# Patient Record
Sex: Male | Born: 1960 | Race: White | Hispanic: No | Smoking: Current every day smoker
Health system: Southern US, Community
[De-identification: ages and names within clinical notes are randomized; demographics above are authoritative.]

## PROBLEM LIST (undated history)

## (undated) DIAGNOSIS — F419 Anxiety disorder, unspecified: Secondary | ICD-10-CM

## (undated) DIAGNOSIS — Z85818 Personal history of malignant neoplasm of other sites of lip, oral cavity, and pharynx: Secondary | ICD-10-CM

## (undated) DIAGNOSIS — Z5189 Encounter for other specified aftercare: Secondary | ICD-10-CM

## (undated) DIAGNOSIS — C801 Malignant (primary) neoplasm, unspecified: Secondary | ICD-10-CM

## (undated) DIAGNOSIS — Z9221 Personal history of antineoplastic chemotherapy: Secondary | ICD-10-CM

## (undated) DIAGNOSIS — Z87898 Personal history of other specified conditions: Secondary | ICD-10-CM

## (undated) DIAGNOSIS — Z923 Personal history of irradiation: Secondary | ICD-10-CM

## (undated) HISTORY — DX: Personal history of malignant neoplasm of other sites of lip, oral cavity, and pharynx: Z85.818

## (undated) HISTORY — DX: Personal history of other specified conditions: Z87.898

## (undated) HISTORY — DX: Encounter for other specified aftercare: Z51.89

## (undated) HISTORY — DX: Personal history of antineoplastic chemotherapy: Z92.21

## (undated) HISTORY — DX: Anxiety disorder, unspecified: F41.9

## (undated) HISTORY — DX: Malignant (primary) neoplasm, unspecified: C80.1

## (undated) HISTORY — DX: Personal history of irradiation: Z92.3

---

## 2005-02-27 ENCOUNTER — Emergency Department (HOSPITAL_COMMUNITY): Admission: EM | Admit: 2005-02-27 | Discharge: 2005-02-28 | Payer: Self-pay | Admitting: Emergency Medicine

## 2005-03-17 ENCOUNTER — Encounter: Admission: RE | Admit: 2005-03-17 | Discharge: 2005-03-17 | Payer: Self-pay | Admitting: Neurology

## 2009-07-07 DIAGNOSIS — Z9221 Personal history of antineoplastic chemotherapy: Secondary | ICD-10-CM

## 2009-07-07 HISTORY — DX: Personal history of antineoplastic chemotherapy: Z92.21

## 2009-07-07 HISTORY — PX: COLONOSCOPY: SHX174

## 2009-11-13 DIAGNOSIS — Z85818 Personal history of malignant neoplasm of other sites of lip, oral cavity, and pharynx: Secondary | ICD-10-CM

## 2009-11-13 HISTORY — DX: Personal history of malignant neoplasm of other sites of lip, oral cavity, and pharynx: Z85.818

## 2009-11-13 HISTORY — PX: OTHER SURGICAL HISTORY: SHX169

## 2009-11-26 ENCOUNTER — Encounter: Admission: AD | Admit: 2009-11-26 | Discharge: 2009-11-26 | Payer: Self-pay | Admitting: Dentistry

## 2009-11-26 ENCOUNTER — Ambulatory Visit: Payer: Self-pay | Admitting: Dentistry

## 2009-11-27 ENCOUNTER — Ambulatory Visit (HOSPITAL_COMMUNITY): Admission: RE | Admit: 2009-11-27 | Discharge: 2009-11-27 | Payer: Self-pay | Admitting: Radiation Oncology

## 2009-11-29 ENCOUNTER — Ambulatory Visit: Payer: Self-pay | Admitting: Dentistry

## 2009-11-29 ENCOUNTER — Ambulatory Visit (HOSPITAL_COMMUNITY): Admission: RE | Admit: 2009-11-29 | Discharge: 2009-11-29 | Payer: Self-pay | Admitting: Dentistry

## 2009-11-29 HISTORY — PX: OTHER SURGICAL HISTORY: SHX169

## 2009-12-04 ENCOUNTER — Ambulatory Visit: Admission: RE | Admit: 2009-12-04 | Discharge: 2010-02-28 | Payer: Self-pay | Admitting: Radiation Oncology

## 2009-12-11 ENCOUNTER — Ambulatory Visit: Payer: Self-pay | Admitting: Dentistry

## 2010-01-24 ENCOUNTER — Ambulatory Visit: Payer: Self-pay | Admitting: Cardiology

## 2010-07-12 IMAGING — PT NM PET TUM IMG INITIAL (PI) SKULL BASE T - THIGH
6 series · 25 of 25 positions shown · non-contrast
Comparison: None available

CLINICAL DATA: Initial treatment strategy for throat cancer.

NUCLEAR MEDICINE PET SKULL BASE TO THIGH
Fasting Blood Glucose:  92
TECHNIQUE: 14.4 mCi F-18 FDG was injected intravenously via the
right antecubital fossa.  Full-ring PET imaging was performed from
the skull base through the mid-thighs 65  minutes after injection.
CT data was obtained and used for attenuation correction and
anatomic localization only.  (This was not acquired as a diagnostic
CT examination.)

[Series 1: pet ac · axial · 3.3mm · 4.69mm/px · z∈[-870,+0]mm · 5 of 267 slices shown]
[im 1/267]
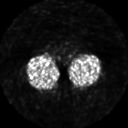
[im 67/267]
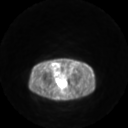
[im 134/267]
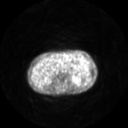
[im 200/267]
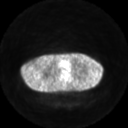
[im 267/267]
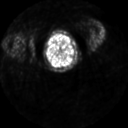

[Series 2: ct images · axial · 3.8mm · 0.98mm/px · z∈[-870,+0]mm · 5 of 266 slices shown]
[im 1/266]
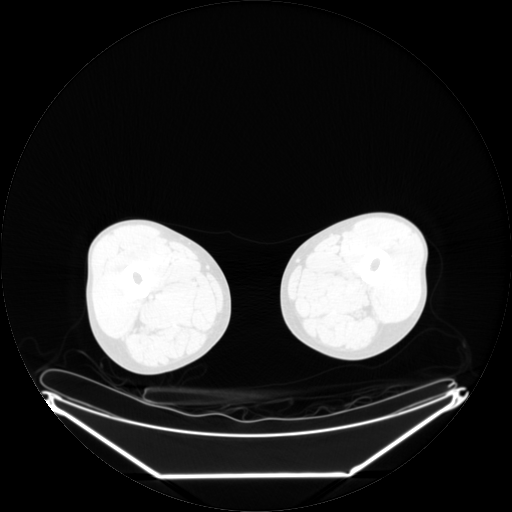
[im 67/266]
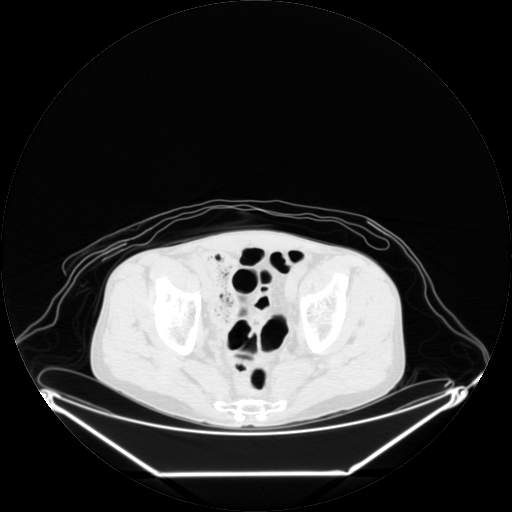
[im 133/266]
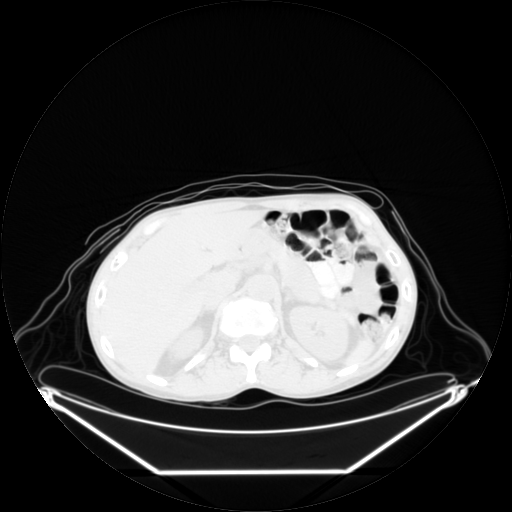
[im 199/266]
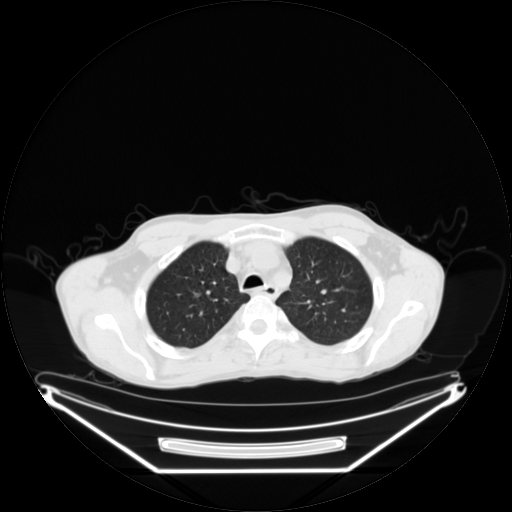
[im 266/266  brain]
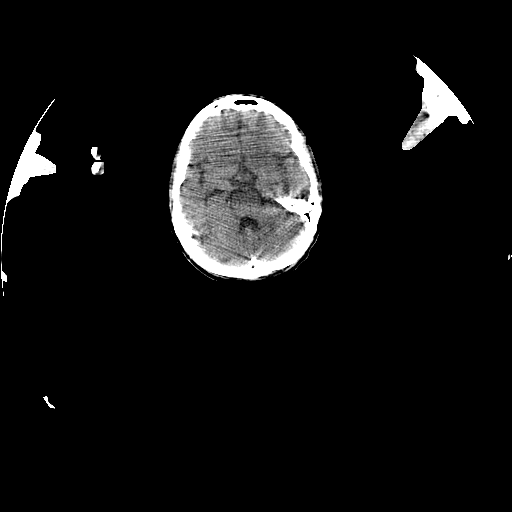

[Series 2: pet nac · axial · 3.3mm · 4.69mm/px · z∈[-870,+0]mm · 6 of 267 slices shown]
[im 1/267]
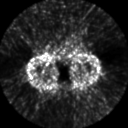
[im 54/267]
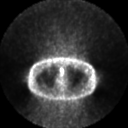
[im 107/267]
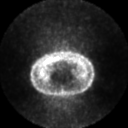
[im 160/267]
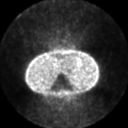
[im 213/267]
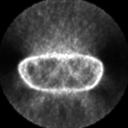
[im 267/267]
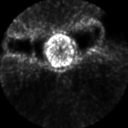

[Series 123: mip · coronal · 3.3mm · 4.69mm/px · 1 of 30 slices shown]
[im 1/30]
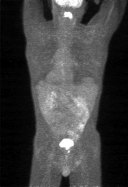

[Series 151: reformatted · axial · 3.3mm · 3.91mm/px · z∈[-870,+0]mm · 6 of 265 slices shown (1 of 2)]
[im 1/265]
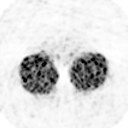
[im 53/265]
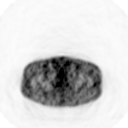
[im 106/265]
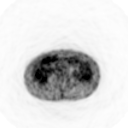
[im 159/265]
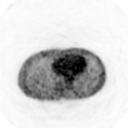
[im 212/265]
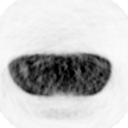
[im 265/265]
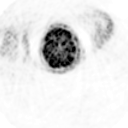

[Series 153: reformatted · coronal · 4.7mm · 6.98mm/px · 2 of 72 slices shown (2 of 2)]
[im 1/72]
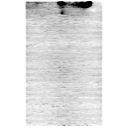
[im 72/72]
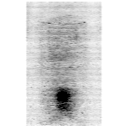

[25 of 25 positions shown; findings below may reference images not displayed]

FINDINGS: Neck: There is intense hypermetabolic activity associated with the
bilateral pharyngeal mucosal thickening in the region of the
palatine tonsils There this activity is intense with SUV max =
(image 26).

There is no hypermetabolic activity the hypopharynx, supraglottic
region, or glottis.

There is mild activity associated with a single small 8 mm short
axis left level II A node (image 29) with SUV max = 2.5.  Similar
right level II A node measures 8 mm short axis (image 29) with SUV
max = 2.5.

 Chest: No hypermetabolic mediastinal or hilar nodes.  No
suspicious pulmonary nodules.

Abdomen / Pelvis:No abnormal hypermetabolic activity within the
solid organs.  No evidence of abdominal or pelvic hypermetabolic
nodes. Within the most inferior pelvis, there is a focus of focal
metabolic activity along the right anterior wall of the distal
rectum adjacent the prostate gland with SUV max = 5.5  (image 217).
There is a tiny gas bubble within the wall of the rectum at the
same level.

Skeleton:No focal hypermetabolic activity to suggest skeletal
metastasis.
IMPRESSION: 1.  Intensely hypermetabolic bilateral palatine tonsilar masses
consistent primary head neck cancer.
2.  No clear evidence of nodal metastasis in the neck.  Single
level II A lymph nodes on left and right with mild metabolic
activity are indeterminate.

3.  No evidence of distant metastasis.
4.  Focal activity within the anterior wall the rectum as
described above.  Differential includes proctitis, diverticulitis,
or colon adenocarcinoma.  Recommend direct optical visualization.

## 2010-09-23 LAB — BASIC METABOLIC PANEL
BUN: 6 mg/dL (ref 6–23)
Potassium: 4.2 mEq/L (ref 3.5–5.1)
Sodium: 140 mEq/L (ref 135–145)

## 2010-09-23 LAB — GLUCOSE, CAPILLARY: Glucose-Capillary: 92 mg/dL (ref 70–99)

## 2010-09-23 LAB — PROTIME-INR
INR: 0.98 (ref 0.00–1.49)
Prothrombin Time: 12.9 seconds (ref 11.6–15.2)

## 2010-09-23 LAB — CBC
Hemoglobin: 13.2 g/dL (ref 13.0–17.0)
MCHC: 34 g/dL (ref 30.0–36.0)
MCV: 102.9 fL — ABNORMAL HIGH (ref 78.0–100.0)
Platelets: 372 10*3/uL (ref 150–400)
RBC: 3.78 MIL/uL — ABNORMAL LOW (ref 4.22–5.81)

## 2010-09-23 LAB — APTT: aPTT: 31 seconds (ref 24–37)

## 2011-11-03 ENCOUNTER — Ambulatory Visit (HOSPITAL_COMMUNITY): Payer: Self-pay | Admitting: Dentistry

## 2011-11-12 ENCOUNTER — Ambulatory Visit (HOSPITAL_COMMUNITY): Payer: Self-pay | Admitting: Dentistry

## 2011-11-12 ENCOUNTER — Encounter (HOSPITAL_COMMUNITY): Payer: Self-pay | Admitting: Dentistry

## 2011-11-12 VITALS — BP 139/84 | HR 64 | Temp 97.3°F

## 2011-11-12 DIAGNOSIS — K117 Disturbances of salivary secretion: Secondary | ICD-10-CM

## 2011-11-12 DIAGNOSIS — Y842 Radiological procedure and radiotherapy as the cause of abnormal reaction of the patient, or of later complication, without mention of misadventure at the time of the procedure: Secondary | ICD-10-CM

## 2011-11-12 DIAGNOSIS — M272 Inflammatory conditions of jaws: Secondary | ICD-10-CM

## 2011-11-12 DIAGNOSIS — K08109 Complete loss of teeth, unspecified cause, unspecified class: Secondary | ICD-10-CM

## 2011-11-12 DIAGNOSIS — M278 Other specified diseases of jaws: Secondary | ICD-10-CM

## 2011-11-12 DIAGNOSIS — K036 Deposits [accretions] on teeth: Secondary | ICD-10-CM

## 2011-11-12 DIAGNOSIS — Z87898 Personal history of other specified conditions: Secondary | ICD-10-CM

## 2011-11-14 ENCOUNTER — Encounter (HOSPITAL_COMMUNITY): Payer: Self-pay | Admitting: Dentistry

## 2011-11-14 DIAGNOSIS — M272 Inflammatory conditions of jaws: Secondary | ICD-10-CM | POA: Insufficient documentation

## 2011-11-14 NOTE — Progress Notes (Signed)
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  HPI: Blake Atkinson is a 51 year old male previously known to dental medicine for a pre radiation therapy dental evaluation on 11/26/2009.  Patient underwent multiple extraction with alveoloplasty and pre-prosthetic surgery as indicated along with gross debridement of remaining dentition on Nov 29, 2009. Patient then underwent chemoradiation therapy from 12/25/2009 through 02/11/2010.  Patient then presented to Dr. Antony Blackbird for evaluation of possible osteoradionecrosis on 11/06/2011. Patient was then referred to dental medicine for evaluation of this area.  Patient currently is complaining of discomfort and exposed bone involving the mandibular left quadrant in the molar area. Patient indicates that this exposed bone has been present since January of 2013 but has led to significant discomfort as of late. Patient eventually was seen by Dr. Roselind Messier for evaluation due to possible correlation to his previous radiation therapy. Dr. Roselind Messier felt this was osteoradionecrosis and sent the patient to me for evaluation for possible referral for  treatment as indicated. At the time, he prescribed the patient a prescription for amoxicillin 500 mg q. 3 times daily for 10 days. He also prescribed pain medication. He also prescribe chlorhexidine rinses to use as a rinse 3 times daily.    PMH: Past Medical History  Diagnosis Date  . 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 History of chemotherapy 2011    Dr. Ubaldo Glassing  . History of radiation therapy 12/25/09 thru 02/11/2010    Dr. Roselind Messier  . History of seizures     X2. alcohol related.      PSH: Past Surgical History  Procedure Date  . Soft palate biopsy 11/13/09    Dr. Josephina Shih. Pathology postive for SCCa.  . S/p extractions with alveoloplasty 11/29/09    S/P extraction of tooth numbers 1, 4, 5, 6, 10, 11, 14, 15, 16, 17, 20, 29, 30, 31, and 32 with alveoloplasty, maxillary right tuberosity reduction, and gross debridement of remaining dentition with Dr. Kristin Bruins    ALLERGIES: No Known Allergies  MEDICATIONS: Current Outpatient Prescriptions  Medication Sig Dispense Refill  . amoxicillin (AMOXIL) 500 MG capsule Take 500 mg by mouth 3 (three) times daily.      . chlorhexidine (PERIDEX) 0.12 % solution Use as directed 15 mLs in the mouth or throat 3 (three) times daily.      Marland Kitchen HYDROcodone-acetaminophen (NORCO) 10-325 MG per tablet Take 1 tablet by mouth every 6 (six) hours as needed.        LABS: Lab Results  Component Value Date   WBC 9.6 11/27/2009   HGB 13.2 11/27/2009   HCT 38.8* 11/27/2009   MCV 102.9* 11/27/2009   PLT 372 11/27/2009      Component Value Date/Time   NA 140 11/27/2009 1310   K 4.2 11/27/2009 1310   CL 103 11/27/2009 1310   CO2 26 11/27/2009 1310   GLUCOSE 81 11/27/2009 1310   BUN 6 11/27/2009 1310   CREATININE 0.77 11/27/2009 1310   CALCIUM 9.3 11/27/2009 1310   GFRNONAA >60 11/27/2009 1310   GFRAA  Value: >60        The eGFR has been calculated using  the MDRD equation. This calculation has not been validated in all clinical situations. eGFR's persistently <60 mL/min signify possible Chronic Kidney Disease. 11/27/2009 1310   Lab Results  Component Value Date   INR 0.98 11/27/2009   No results found for this basename: PTT    SOCIAL HISTORY: History   Social History  . Marital Status: Divorced    Spouse Name: N/A    Number of Children: N/A  . Years of Education: N/A   Occupational History  . Not on file.   Social History Main Topics  . Smoking status: Current Everyday Smoker -- 1.0 packs/day for 28 years    Types: Cigarettes  .  Smokeless tobacco: Never Used  . Alcohol Use: No     Quit in 2011.  . Drug Use: No  . Sexually Active: Not on file   Other Topics Concern  . Not on file   Social History Narrative   Patient is single.Patient still smoking approximately one pack per day for the past 28 years.Patient quit drinking alcohol in 2011.    FAMILY HISTORY: Family History  Problem Relation Age of Onset  . Dementia Father   . Hypertension    . Hypertension Mother   . Stroke Mother   . Thyroid disease Mother   . 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  HPI: Blake Atkinson is a 51 year old male previously known to dental medicine for a pre radiation therapy dental evaluation on 11/26/2009.  Patient underwent multiple extraction with alveoloplasty and pre-prosthetic surgery as indicated along with gross debridement of remaining dentition on Nov 29, 2009. Patient then underwent chemoradiation therapy from 12/25/2009 through 02/11/2010.  Patient then presented to Dr. Antony Blackbird for evaluation of possible osteoradionecrosis on 11/06/2011. Patient was then referred to dental medicine for evaluation of this area.  Patient currently is complaining of discomfort and exposed bone involving the mandibular left quadrant in the molar area. Patient indicates that this exposed bone has been present since January of 2013 but has led to significant discomfort as of late. Patient eventually was seen by Dr. Roselind Messier for evaluation due to possible correlation to his previous radiation therapy. Dr. Roselind Messier felt this was osteoradionecrosis and sent the patient to me for evaluation for possible referral for  treatment as indicated. At the time, he prescribed the patient a prescription for amoxicillin 500 mg q. 3 times daily for 10 days. He also prescribed pain medication. He also prescribe chlorhexidine rinses to use as a rinse 3 times  daily.   DENTAL EXAMINATION:  GENERAL: Patient is a well-developed, well-nourished male in no acute distress. HEAD AND NECK:There is no obvious lymphadenopathy palpated at this time. Patient indicates that he did have some swelling involving the left face over the left body of mandible molar area. This appears to have resolved since the antibiotics were started. Patient also points to a possible fistulous tract that has appears to have resolved at the angle of the mandible. Patient reports numbness of his left chin area from the mental foramen forward to the center of his chin.  INTRAORAL EXAM: The patient has xerostomia. There is exposed bone involving the left mandible area numbers 17-19. This measures approximately 15 mm x 8 mm. There is no purulence associated with this exposed bone. DENTITION: Patient is missing all teeth with the exception of tooth numbers 21, 22, 23, 24, 25, 26, 27, and 28.  PERIODONTAL: Patient has chronic  periodontitis with some plaque accumulations and minimal tooth mobility. DENTAL CARIES/SUBOPTIMAL RESTORATIONS: No obvious dental caries are noted. ENDODONTIC:  Patient denies acute pulpitis symptoms.I do not see any evidence of periapical pathology upon view of the orthopantogram. CROWN AND BRIDGE: there are no crown restorations. PROSTHODONTIC: Patient has no upper complete denture or lower partial denture. OCCLUSION: she has a poor occlusal scheme secondary to multiple missing teeth and lack of replacement of missing teeth with dental prostheses.  RADIOGRAPHIC INTERPRETATION: An Orthopantogram was taken on 11/12/2011. There are multiple missing teeth. Tooth numbers 21-28 are remaining. There is incipient to moderate bone loss. There is a radiolucent area involving the left mandible in the posterior molar area that may represent the radiolucency associated with osteoradionecrosis.   ASSESSMENTS: 1. 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  2. numbness involving the left and talus area from the mental foramen forward to the midline. 3. xerostomia -radiation induced . 4. chronic periodontitis with bone loss 5. minimal plaque accumulations 6. multiple missing teeth 7. no history of dentures 8. poor occlusal scheme and malocclusion  PLAN/RECOMMENDATIONS: 1. I discussed the  current findings of osteoradionecrosis involving the left mandible. Due to the history of infection, possible fistulous tract from an infection to the left angle of the mandible, and current numbness of the left chin, I feel that referral to an oral surgeon is indicated. Patient may require surgical intervention and hyperbaric oxygen therapy through the wound center in [facility] or possibly [facility] as indicated. Dr. Roselind Messier has indicated that he will assist in referral to the wound center if needed. I have made initial contact with Dr. Rayfield Citizen office (oral and maxillofacial surgery) concerning evaluation for possible treatment options for the patient. Patient is tentatively been given an appointment for May 30 at 2 PM. In the meantime, the patient is to continue his amoxicillin antibiotic therapy as well as the use of chlorhexidine rinses 3 times daily. If the swelling worsens, the patient's contact myself or Dr. Roselind Messier for evaluation for additional antibiotic therapy as indicated.  Upon completion of treatment I was osteoradionecrosis, the patient has been made aware of the need for followup with a primary dentist for evaluation for subsequent fabrication of upper complete and lower cast partial dentures along with periodontal maintenance therapy and other treatment is indicated.    2. Discussion of findings with medical team and coordination of future medical and dental care.  Charlynne Pander, DDS

## 2012-09-15 ENCOUNTER — Telehealth (HOSPITAL_COMMUNITY): Payer: Self-pay | Admitting: Dental General Practice

## 2012-09-15 ENCOUNTER — Telehealth (HOSPITAL_COMMUNITY): Payer: Self-pay | Admitting: Dentistry

## 2012-09-15 NOTE — Telephone Encounter (Signed)
Spoke with Tait' mother Olis Viverette. Patient is to have follow up appointment with Dr. Jeanice Lim in early April. I expressed the need for Kaysin to continue  His dental care with the dentist of his choice for fabrication of full upper denture and lower partial denture and continued dental care.. Mother understood completely. She may have [place]  Help find another Dentist in the area after the follow up with Dr. Jeanice Lim she will call back after she talks with Fayrene Fearing.Marland Kitchen   djs

## 2012-09-15 NOTE — Telephone Encounter (Signed)
09/15/2012 I discussed Mr. Blake Atkinson case with Dr. Doren Custard surgeon. The patient had debridement of the mandible in January 2014 after hyperbaric oxygen therapy. Patient then had additional hyperbaric oxygen therapy. The patient was then seen on followup on 09/06/2012 by Dr. Lincoln Brigham. The area of osteoradionecrosis has resolved. The patient still needs additional healing before fabrication of the dentures can proceed. Dr. Jeanice Atkinson recommended reevaluation by Dr. Jeanice Atkinson in early of April of 2014 with subsequent fabrication of upper complete denture and lower cast partial denture in May of 2014. This information will be provided to the patient and his mother. Ideally the upper complete denture lower cast partial denture needs to be fabricated by the same dentist who will be maintaining periodontal care for the patient. This will not be dental medicine. I will see the patient after the next visit with Dr. Jeanice Atkinson and assist the patient in finding a dentist to fabricate the dentures and provide routine dental care. Charlynne Pander, DDS

## 2017-01-28 DIAGNOSIS — G8929 Other chronic pain: Secondary | ICD-10-CM | POA: Diagnosis not present

## 2017-01-28 DIAGNOSIS — R6884 Jaw pain: Secondary | ICD-10-CM | POA: Diagnosis not present

## 2017-01-28 DIAGNOSIS — Z79899 Other long term (current) drug therapy: Secondary | ICD-10-CM | POA: Diagnosis not present

## 2017-02-19 ENCOUNTER — Encounter: Payer: Self-pay | Admitting: Physician Assistant

## 2017-02-19 ENCOUNTER — Ambulatory Visit (INDEPENDENT_AMBULATORY_CARE_PROVIDER_SITE_OTHER): Payer: Medicaid Other | Admitting: Physician Assistant

## 2017-02-19 VITALS — BP 104/67 | HR 63 | Temp 97.4°F | Ht 71.0 in | Wt 122.2 lb

## 2017-02-19 DIAGNOSIS — M272 Inflammatory conditions of jaws: Secondary | ICD-10-CM | POA: Diagnosis not present

## 2017-02-19 DIAGNOSIS — G893 Neoplasm related pain (acute) (chronic): Secondary | ICD-10-CM

## 2017-02-19 DIAGNOSIS — F419 Anxiety disorder, unspecified: Secondary | ICD-10-CM

## 2017-02-19 DIAGNOSIS — Y842 Radiological procedure and radiotherapy as the cause of abnormal reaction of the patient, or of later complication, without mention of misadventure at the time of the procedure: Secondary | ICD-10-CM

## 2017-02-19 DIAGNOSIS — F411 Generalized anxiety disorder: Secondary | ICD-10-CM | POA: Diagnosis not present

## 2017-02-19 MED ORDER — ALPRAZOLAM 1 MG PO TABS: 1.0000 mg | ORAL_TABLET | Freq: Three times a day (TID) | ORAL | 2 refills | Status: DC | PRN

## 2017-02-19 NOTE — Progress Notes (Signed)
BP 104/67   Pulse 63   Temp (!) 97.4 F (36.3 C) (Oral)   Ht 5\' 11"  (1.803 m)   Wt 122 lb 3.2 oz (55.4 kg)   BMI 17.04 kg/m    Subjective:    Patient ID: Blake Atkinson, male    DOB: [birth year], 56 y.o.   MRN: [id number]  HPI: Blake Atkinson is a 56 y.o. male presenting on 02/19/2017 for New Patient (Initial Visit) and Establish Care  Patient here to be established as new patient at [facility]. He was a patient of Dr. Irene Shipper.  He was treated for cancer of the throat and soft palate. Ever since then has had chronic pain dsecondary to radiation. He even had damage to the mandible.  He is under of care of Dr. Tilden Dome for chronic pain.  We have discussed him needing to stay there at this time because of [place] law about one provider prescribing both pain med and benzodiazepine.  He understands and will continue. We have discussed the future need for a medication like an SSRI. He gets monthly drug screend done at this time.  Relevant past medical, surgical, family and social history reviewed and updated as indicated. Allergies and medications reviewed and updated.  Past Medical History:  Diagnosis Date  . Anxiety   . Blood transfusion without reported diagnosis   . Cancer ([place])   . 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 History of chemotherapy 2011   Dr. Jacquiline Doe  . History of radiation therapy 12/25/09 thru 02/11/2010   Dr. Sondra Come  . History of seizures    X2. alcohol related.    Past Surgical History:  Procedure Laterality Date  . S/P extractions with alveoloplasty  11/29/09   S/P extraction of tooth numbers 1, 4, 5, 6, 10, 11, 14, 15, 16, 17, 20, 29, 30, 31, and 32 with alveoloplasty, maxillary right tuberosity reduction, and gross debridement of remaining dentition with Dr. Enrique Sack  . soft palate biopsy  11/13/09   Dr. Adriana Reams. Pathology postive for SCCa.    Review of Systems  Constitutional:  Negative.  Negative for appetite change, fatigue and unexpected weight change.  HENT: Positive for ear pain, trouble swallowing and voice change.   Eyes: Negative.  Negative for pain and visual disturbance.  Respiratory: Negative.  Negative for cough, chest tightness, shortness of breath and wheezing.   Cardiovascular: Negative.  Negative for chest pain, palpitations and leg swelling.  Gastrointestinal: Negative.  Negative for abdominal pain, diarrhea, nausea and vomiting.  Endocrine: Negative.   Genitourinary: Negative.   Musculoskeletal: Positive for arthralgias and myalgias.  Skin: Negative.  Negative for color change and rash.  Neurological: Positive for headaches. Negative for weakness and numbness.  Psychiatric/Behavioral: Negative.     Allergies as of 02/19/2017   No Known Allergies     Medication List       Accurate as of 02/19/17  9:52 AM. Always use your most recent med list.          ALLERGY 10 MG tablet Generic drug:  loratadine Take 10 mg by mouth daily.   ALPRAZolam 1 MG tablet Commonly known as:  XANAX Take 1 tablet (1 mg total) by mouth 3 (three) times daily as needed for anxiety.   HYDROcodone-acetaminophen 10-325 MG tablet Commonly known as:  NORCO Take 1 tablet by mouth every 6 (six) hours as needed.   ibuprofen 200 MG  tablet Commonly known as:  ADVIL,MOTRIN Take 200 mg by mouth every 6 (six) hours as needed.          Objective:    BP 104/67   Pulse 63   Temp (!) 97.4 F (36.3 C) (Oral)   Ht 5\' 11"  (1.803 m)   Wt 122 lb 3.2 oz (55.4 kg)   BMI 17.04 kg/m   No Known Allergies  Physical Exam  Constitutional: He appears well-developed. No distress.  Thin male, jaw and neck atrophy  HENT:  Head: Normocephalic and atraumatic.  Eyes: Pupils are equal, round, and reactive to light. Conjunctivae and EOM are normal.  Neck: Normal range of motion. Neck supple.  Cardiovascular: Normal rate, regular rhythm and normal heart sounds.     Pulmonary/Chest: Effort normal and breath sounds normal.  Abdominal: Soft. Bowel sounds are normal.  Musculoskeletal: Normal range of motion.  Skin: Skin is warm and dry.        Assessment & Plan:   1. 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 GAD (generalized anxiety disorder) - ALPRAZolam (XANAX) 1 MG tablet; Take 1 tablet (1 mg total) by mouth 3 (three) times daily as needed for anxiety.  Dispense: 90 tablet; Refill: 2  4. Osteoradionecrosis of mandible    Current Outpatient Prescriptions:  .  ALPRAZolam (XANAX) 1 MG tablet, Take 1 tablet (1 mg total) by mouth 3 (three) times daily as needed for anxiety., Disp: 90 tablet, Rfl: 2 .  HYDROcodone-acetaminophen (NORCO) 10-325 MG per tablet, Take 1 tablet by mouth every 6 (six) hours as needed., Disp: , Rfl:  .  ibuprofen (ADVIL,MOTRIN) 200 MG tablet, Take 200 mg by mouth every 6 (six) hours as needed., Disp: , Rfl:  .  loratadine (ALLERGY) 10 MG tablet, Take 10 mg by mouth daily., Disp: , Rfl:  Continue all other maintenance medications as listed above.  Follow up plan: Return in about 3 months (around 05/22/2017) for recheck, SIGN FOR RECORDS.  Educational handout given for [place] PA-C [facility] [address]  [place], [place] [address] [phone]   02/19/2017, 9:52 AM

## 2017-02-19 NOTE — Patient Instructions (Signed)
In a few days you may receive a survey in the mail or online from [employer] regarding your visit with us today. Please take a moment to fill this out. Your feedback is very important to our whole office. It can help us better understand your needs as well as improve your experience and satisfaction. Thank you for taking your time to complete it. We care about you.  Burnham Trost, PA-C  

## 2017-05-21 DIAGNOSIS — H40033 Anatomical narrow angle, bilateral: Secondary | ICD-10-CM | POA: Diagnosis not present

## 2017-05-21 DIAGNOSIS — H1013 Acute atopic conjunctivitis, bilateral: Secondary | ICD-10-CM | POA: Diagnosis not present

## 2017-05-26 ENCOUNTER — Encounter: Payer: Self-pay | Admitting: Physician Assistant

## 2017-05-26 ENCOUNTER — Ambulatory Visit: Payer: Medicaid Other | Admitting: Physician Assistant

## 2017-05-26 VITALS — BP 115/73 | HR 74 | Temp 97.7°F | Ht 71.0 in | Wt 128.2 lb

## 2017-05-26 DIAGNOSIS — F1011 Alcohol abuse, in remission: Secondary | ICD-10-CM

## 2017-05-26 DIAGNOSIS — G893 Neoplasm related pain (acute) (chronic): Secondary | ICD-10-CM

## 2017-05-26 DIAGNOSIS — F411 Generalized anxiety disorder: Secondary | ICD-10-CM

## 2017-05-26 DIAGNOSIS — Y842 Radiological procedure and radiotherapy as the cause of abnormal reaction of the patient, or of later complication, without mention of misadventure at the time of the procedure: Principal | ICD-10-CM

## 2017-05-26 DIAGNOSIS — M272 Inflammatory conditions of jaws: Secondary | ICD-10-CM | POA: Diagnosis not present

## 2017-05-26 MED ORDER — ALPRAZOLAM 1 MG PO TABS: 1.0000 mg | ORAL_TABLET | Freq: Three times a day (TID) | ORAL | 5 refills | Status: DC | PRN

## 2017-05-26 MED ORDER — LORATADINE 10 MG PO TABS: 10.0000 mg | ORAL_TABLET | Freq: Every day | ORAL | 11 refills | Status: DC

## 2017-05-26 NOTE — Patient Instructions (Signed)
Please have [facility] send information to our office concerning last 12 months of drug screens.  The DMV paperwork is needed to be completed and I have only seen the patient twice.   Particia Nearing PA-C
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
## 2017-07-15 ENCOUNTER — Telehealth: Payer: Self-pay | Admitting: Physician Assistant

## 2017-07-15 NOTE — Telephone Encounter (Signed)
The information from [facility] sent to [place] would help, otherwise I have no documentation of anything that has transpired.  And has a previous form been completed somewhere?

## 2017-07-15 NOTE — Telephone Encounter (Signed)
I have reviewed all Dr. Irene Shipper notes, they contain information about will check, smoking cessation, throat cancer.  They do not contain any information about why he lost his driver's license. I asked the patient why he has lost his license or why he was restricted.  People get the [facility] forms usually they have a medical condition such as diabetes, epilepsy that affects their safety when driving or they have substance abuse for driving under the influence diagnoses.  He could not tell me what had happened.  Therefore I did not feel comfortable filling out the form having only seen him such a brief time and not knowing any long history.
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
## 2017-07-15 NOTE — Telephone Encounter (Signed)
Mother will call to request records from [facility]

## 2017-07-22 ENCOUNTER — Telehealth: Payer: Self-pay | Admitting: Physician Assistant

## 2017-07-22 NOTE — Telephone Encounter (Signed)
No release on file for DR Arlina Robes is aware that pt will have to come to the office to sign another release.

## 2017-07-24 ENCOUNTER — Ambulatory Visit: Payer: Medicaid Other | Admitting: Physician Assistant

## 2017-12-10 ENCOUNTER — Other Ambulatory Visit: Payer: Self-pay | Admitting: Physician Assistant

## 2017-12-10 DIAGNOSIS — F411 Generalized anxiety disorder: Secondary | ICD-10-CM

## 2017-12-11 ENCOUNTER — Encounter: Payer: Self-pay | Admitting: Physician Assistant

## 2017-12-11 ENCOUNTER — Ambulatory Visit: Payer: Medicaid Other | Admitting: Physician Assistant

## 2017-12-11 VITALS — BP 121/76 | HR 60 | Ht 71.0 in | Wt 123.6 lb

## 2017-12-11 DIAGNOSIS — Z0000 Encounter for general adult medical examination without abnormal findings: Secondary | ICD-10-CM

## 2017-12-11 DIAGNOSIS — F411 Generalized anxiety disorder: Secondary | ICD-10-CM | POA: Diagnosis not present

## 2017-12-11 MED ORDER — ALPRAZOLAM 1 MG PO TABS: 1.0000 mg | ORAL_TABLET | Freq: Three times a day (TID) | ORAL | 5 refills | Status: DC | PRN

## 2017-12-13 NOTE — Progress Notes (Signed)
BP 121/76   Pulse 60   Ht _0  (1.803 m)   Wt 123 lb 9.6 oz (56.1 kg)   BMI 17.24 kg/m    Subjective:    Patient ID: Blake Atkinson, male    DOB: 1961-03-07, 57 y.o.   MRN: [id number]  HPI: Blake Atkinson is a 57 y.o. male presenting on 12/11/2017 for Anxiety (refill on alprazolam) This patient comes in for 29-monthrecheck on his chronic conditions.  [place]  He did have edema in the past and does need a new driver's license form completed.  He states that he had a DUI and 821and another one around 120  His last drink was 10 years ago whenever he was diagnosed with cancer.  We can fill out the form whenever he gets it back to [place]  Otherwise he states he is doing well and does need refill on his medicines.   Past Medical History:  Diagnosis Date  . Anxiety   . Blood transfusion without reported diagnosis   . [place]   . 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 History of chemotherapy 2011   Dr. DJacquiline Doe . History of radiation therapy 12/25/09 thru 02/11/2010   Dr. KSondra Come . History of seizures    X2. alcohol related.   Relevant past medical, surgical, family and social history reviewed and updated as indicated. Interim medical history since our last visit reviewed. Allergies and medications reviewed and updated. DATA REVIEWED: CHART IN EPIC  Family History reviewed for pertinent findings.  Review of Systems  Constitutional: Negative.  Negative for appetite change and fatigue.  HENT: Negative.   Eyes: Negative.  Negative for pain and visual disturbance.  Respiratory: Negative.  Negative for cough, chest tightness, shortness of breath and wheezing.   Cardiovascular: Negative.  Negative for chest pain, palpitations and leg swelling.  Gastrointestinal: Negative.  Negative for abdominal pain, diarrhea, nausea and vomiting.  Endocrine: Negative.   Genitourinary: Negative.     Musculoskeletal: Negative.   Skin: Negative.  Negative for color change and rash.  Neurological: Negative.  Negative for weakness, numbness and headaches.  Psychiatric/Behavioral: Negative.     Allergies as of 12/11/2017   No Known Allergies     Medication List        Accurate as of 12/11/17 11:59 PM. Always use your most recent med list.          ALPRAZolam 1 MG tablet Commonly known as:  XANAX Take 1 tablet (1 mg total) by mouth 3 (three) times daily as needed for anxiety.   HYDROcodone-acetaminophen 10-325 MG tablet Commonly known as:  NORCO Take 1 tablet by mouth every 6 (six) hours as needed.   ibuprofen 200 MG tablet Commonly known as:  ADVIL,MOTRIN Take 200 mg by mouth every 6 (six) hours as needed.   loratadine 10 MG tablet Commonly known as:  ALLERGY Take 1 tablet (10 mg total) by mouth daily.          Objective:    BP 121/76   Pulse 60   Ht _1  (1.803 m)   Wt 123 lb 9.6 oz (56.1 kg)   BMI 17.24 kg/m   No Known Allergies  Wt Readings from Last 3 Encounters:  12/11/17 123 lb 9.6 oz (56.1 kg)  05/26/17 128 lb 3.2 oz (58.2 kg)  02/19/17 122 lb 3.2 oz (  55.4 kg)    Physical Exam  Constitutional: He appears well-developed and well-nourished. No distress.  HENT:  Head: Normocephalic and atraumatic.  Eyes: Pupils are equal, round, and reactive to light. Conjunctivae and EOM are normal.  Cardiovascular: Normal rate, regular rhythm and normal heart sounds.  Pulmonary/Chest: Effort normal and breath sounds normal. No respiratory distress.  Skin: Skin is warm and dry.  Psychiatric: He has a normal mood and affect. His behavior is normal.  Nursing note and vitals reviewed.       Assessment & Plan:   1. GAD (generalized anxiety disorder) - ALPRAZolam (XANAX) 1 MG tablet; Take 1 tablet (1 mg total) by mouth 3 (three) times daily as needed for anxiety.  Dispense: 90 tablet; Refill: 5  2. Well adult exam - CMP14+EGFR; Future - CBC with  Differential/Platelet; Future - Lipid panel; Future - PSA; Future   Continue all other maintenance medications as listed above.  Follow up plan: Return in about 6 months (around 06/12/2018) for recheck.  Educational handout given for [place] PA-C [facility] [address]  [place], [place] [address] [phone]   12/13/2017, 5:09 PM

## 2018-01-04 ENCOUNTER — Other Ambulatory Visit: Payer: Medicaid Other

## 2018-01-04 DIAGNOSIS — Z0000 Encounter for general adult medical examination without abnormal findings: Secondary | ICD-10-CM

## 2018-01-05 LAB — CMP14+EGFR
ALK PHOS: 82 IU/L (ref 39–117)
ALT: 6 IU/L (ref 0–44)
AST: 13 IU/L (ref 0–40)
Albumin/Globulin Ratio: 2 (ref 1.2–2.2)
Albumin: 4.5 g/dL (ref 3.5–5.5)
BILIRUBIN TOTAL: 0.3 mg/dL (ref 0.0–1.2)
BUN/Creatinine Ratio: 5 — ABNORMAL LOW (ref 9–20)
BUN: 4 mg/dL — AB (ref 6–24)
CHLORIDE: 100 mmol/L (ref 96–106)
CO2: 29 mmol/L (ref 20–29)
Calcium: 9.3 mg/dL (ref 8.7–10.2)
Creatinine, Ser: 0.73 mg/dL — ABNORMAL LOW (ref 0.76–1.27)
GFR calc non Af Amer: 102 mL/min/{1.73_m2} (ref >59)
GFR, EST AFRICAN AMERICAN: 117 mL/min/{1.73_m2} (ref >59)
GLUCOSE: 93 mg/dL (ref 65–99)
Globulin, Total: 2.2 g/dL (ref 1.5–4.5)
POTASSIUM: 4 mmol/L (ref 3.5–5.2)
Sodium: 143 mmol/L (ref 134–144)
TOTAL PROTEIN: 6.7 g/dL (ref 6.0–8.5)

## 2018-01-05 LAB — CBC WITH DIFFERENTIAL/PLATELET
BASOS ABS: 0 10*3/uL (ref 0.0–0.2)
Basos: 0 %
EOS (ABSOLUTE): 0.1 10*3/uL (ref 0.0–0.4)
Eos: 1 %
Hematocrit: 34.3 % — ABNORMAL LOW (ref 37.5–51.0)
Hemoglobin: 11.4 g/dL — ABNORMAL LOW (ref 13.0–17.7)
IMMATURE GRANS (ABS): 0 10*3/uL (ref 0.0–0.1)
Immature Granulocytes: 0 %
LYMPHS: 27 %
Lymphocytes Absolute: 2.6 10*3/uL (ref 0.7–3.1)
MCH: 31.6 pg (ref 26.6–33.0)
MCHC: 33.2 g/dL (ref 31.5–35.7)
MCV: 95 fL (ref 79–97)
MONOS ABS: 0.6 10*3/uL (ref 0.1–0.9)
Monocytes: 6 %
NEUTROS ABS: 6.1 10*3/uL (ref 1.4–7.0)
NEUTROS PCT: 66 %
Platelets: 359 10*3/uL (ref 150–450)
RBC: 3.61 x10E6/uL — ABNORMAL LOW (ref 4.14–5.80)
RDW: 14.2 % (ref 12.3–15.4)
WBC: 9.5 10*3/uL (ref 3.4–10.8)

## 2018-01-05 LAB — PSA: Prostate Specific Ag, Serum: 0.3 ng/mL (ref 0.0–4.0)

## 2018-01-05 LAB — LIPID PANEL
CHOL/HDL RATIO: 3.8 ratio (ref 0.0–5.0)
Cholesterol, Total: 132 mg/dL (ref 100–199)
HDL: 35 mg/dL — ABNORMAL LOW (ref >39)
LDL CALC: 80 mg/dL (ref 0–99)
Triglycerides: 87 mg/dL (ref 0–149)
VLDL CHOLESTEROL CAL: 17 mg/dL (ref 5–40)

## 2018-02-03 DIAGNOSIS — C760 Malignant neoplasm of head, face and neck: Secondary | ICD-10-CM | POA: Diagnosis not present

## 2018-02-03 DIAGNOSIS — G8929 Other chronic pain: Secondary | ICD-10-CM | POA: Diagnosis not present

## 2018-02-03 DIAGNOSIS — G893 Neoplasm related pain (acute) (chronic): Secondary | ICD-10-CM | POA: Diagnosis not present

## 2018-02-03 DIAGNOSIS — Z79899 Other long term (current) drug therapy: Secondary | ICD-10-CM | POA: Diagnosis not present

## 2018-02-03 DIAGNOSIS — R6884 Jaw pain: Secondary | ICD-10-CM | POA: Diagnosis not present

## 2018-03-04 DIAGNOSIS — C760 Malignant neoplasm of head, face and neck: Secondary | ICD-10-CM | POA: Diagnosis not present

## 2018-03-04 DIAGNOSIS — R6884 Jaw pain: Secondary | ICD-10-CM | POA: Diagnosis not present

## 2018-03-04 DIAGNOSIS — G893 Neoplasm related pain (acute) (chronic): Secondary | ICD-10-CM | POA: Diagnosis not present

## 2018-03-04 DIAGNOSIS — G8929 Other chronic pain: Secondary | ICD-10-CM | POA: Diagnosis not present

## 2018-03-04 DIAGNOSIS — Z79899 Other long term (current) drug therapy: Secondary | ICD-10-CM | POA: Diagnosis not present

## 2018-04-01 DIAGNOSIS — Z79899 Other long term (current) drug therapy: Secondary | ICD-10-CM | POA: Diagnosis not present

## 2018-04-22 DIAGNOSIS — Z79899 Other long term (current) drug therapy: Secondary | ICD-10-CM | POA: Diagnosis not present

## 2018-05-20 DIAGNOSIS — Z79899 Other long term (current) drug therapy: Secondary | ICD-10-CM | POA: Diagnosis not present

## 2018-06-08 ENCOUNTER — Other Ambulatory Visit: Payer: Self-pay | Admitting: Physician Assistant

## 2018-06-08 DIAGNOSIS — F411 Generalized anxiety disorder: Secondary | ICD-10-CM

## 2018-06-10 ENCOUNTER — Other Ambulatory Visit: Payer: Self-pay | Admitting: Physician Assistant

## 2018-06-10 DIAGNOSIS — F411 Generalized anxiety disorder: Secondary | ICD-10-CM

## 2018-06-11 ENCOUNTER — Other Ambulatory Visit: Payer: Self-pay | Admitting: Physician Assistant

## 2018-06-11 ENCOUNTER — Telehealth: Payer: Self-pay | Admitting: Physician Assistant

## 2018-06-11 DIAGNOSIS — F411 Generalized anxiety disorder: Secondary | ICD-10-CM

## 2018-06-11 MED ORDER — ALPRAZOLAM 1 MG PO TABS: 1.0000 mg | ORAL_TABLET | Freq: Three times a day (TID) | ORAL | 0 refills | Status: DC | PRN

## 2018-06-11 NOTE — Telephone Encounter (Signed)
Seen here in July and has follow up in December. Please advise on refill.

## 2018-06-11 NOTE — Telephone Encounter (Signed)
Patient aware.

## 2018-06-11 NOTE — Telephone Encounter (Signed)
Sent a limited amount

## 2018-06-15 ENCOUNTER — Ambulatory Visit: Payer: Medicaid Other | Admitting: Physician Assistant

## 2018-06-15 ENCOUNTER — Encounter: Payer: Self-pay | Admitting: Physician Assistant

## 2018-06-15 VITALS — BP 109/74 | HR 59 | Temp 97.1°F | Ht 71.0 in | Wt 128.2 lb

## 2018-06-15 DIAGNOSIS — J301 Allergic rhinitis due to pollen: Secondary | ICD-10-CM | POA: Diagnosis not present

## 2018-06-15 DIAGNOSIS — F411 Generalized anxiety disorder: Secondary | ICD-10-CM | POA: Diagnosis not present

## 2018-06-15 DIAGNOSIS — G893 Neoplasm related pain (acute) (chronic): Secondary | ICD-10-CM | POA: Diagnosis not present

## 2018-06-15 MED ORDER — LORATADINE 10 MG PO TABS: 10.0000 mg | ORAL_TABLET | Freq: Every day | ORAL | 11 refills | Status: DC

## 2018-06-15 MED ORDER — ALPRAZOLAM 1 MG PO TABS: 1.0000 mg | ORAL_TABLET | Freq: Three times a day (TID) | ORAL | 5 refills | Status: DC | PRN

## 2018-06-15 NOTE — Progress Notes (Signed)
BP 109/74   Pulse (!) 59   Temp (!) 97.1 F (36.2 C) (Oral)   Ht _0  (1.803 m)   Wt 128 lb 3.2 oz (58.2 kg)   BMI 17.88 kg/m    Subjective:    Patient ID: Blake Atkinson, male    DOB: 1961/04/27, 57 y.o.   MRN: [id number]  HPI: Blake Atkinson is a 57 y.o. male presenting on 06/15/2018 for Anxiety (refill alprazolam)  This patient comes in for periodic recheck on medications and conditions including anxiety and allergic rhinitis.  There are no new issues.   All medications are reviewed today. There are no reports of any problems with the medications. All of the medical conditions are reviewed and updated.  Lab work is reviewed and will be ordered as medically necessary. There are no new problems reported with today's visit.   Past Medical History:  Diagnosis Date  . Anxiety   . Blood transfusion without reported diagnosis   . Cancer ([place])   . 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 History of chemotherapy 2011   Dr. Jacquiline Doe  . History of radiation therapy 12/25/09 thru 02/11/2010   Dr. Sondra Come  . History of seizures    X2. alcohol related.   Relevant past medical, surgical, family and social history reviewed and updated as indicated. Interim medical history since our last visit reviewed. Allergies and medications reviewed and updated. DATA REVIEWED: CHART IN EPIC  Family History reviewed for pertinent findings.  Review of Systems  Constitutional: Negative.  Negative for appetite change and fatigue.  Eyes: Negative for pain and visual disturbance.  Respiratory: Negative.  Negative for cough, chest tightness, shortness of breath and wheezing.   Cardiovascular: Negative.  Negative for chest pain, palpitations and leg swelling.  Gastrointestinal: Negative.  Negative for abdominal pain, diarrhea, nausea and vomiting.  Genitourinary: Negative.   Skin: Negative.  Negative for color change and rash.  Neurological:  Negative.  Negative for weakness, numbness and headaches.  Psychiatric/Behavioral: Negative.     Allergies as of 06/15/2018   No Known Allergies     Medication List        Accurate as of 06/15/18  3:33 PM. Always use your most recent med list.          ALPRAZolam 1 MG tablet Commonly known as:  XANAX Take 1 tablet (1 mg total) by mouth 3 (three) times daily as needed for anxiety.   loratadine 10 MG tablet Commonly known as:  CLARITIN Take 1 tablet (10 mg total) by mouth daily.          Objective:    BP 109/74   Pulse (!) 59   Temp (!) 97.1 F (36.2 C) (Oral)   Ht _1  (1.803 m)   Wt 128 lb 3.2 oz (58.2 kg)   BMI 17.88 kg/m   No Known Allergies  Wt Readings from Last 3 Encounters:  06/15/18 128 lb 3.2 oz (58.2 kg)  12/11/17 123 lb 9.6 oz (56.1 kg)  05/26/17 128 lb 3.2 oz (58.2 kg)    Physical Exam  Constitutional: He appears well-developed and well-nourished. No distress.  HENT:  Head: Normocephalic and atraumatic.  Eyes: Pupils are equal, round, and reactive to light. Conjunctivae and EOM are normal.  Cardiovascular: Normal rate, regular rhythm and normal heart sounds.  Pulmonary/Chest: Effort normal and breath sounds normal. No respiratory distress.  Skin: Skin is warm and  dry.  Psychiatric: He has a normal mood and affect. His behavior is normal.  Nursing note and vitals reviewed.   Results for orders placed or performed in visit on 01/04/18  PSA  Result Value Ref Range   Prostate Specific Ag, Serum 0.3 0.0 - 4.0 ng/mL  Lipid panel  Result Value Ref Range   Cholesterol, Total 132 100 - 199 mg/dL   Triglycerides 87 0 - 149 mg/dL   HDL 35 (L) >39 mg/dL   VLDL Cholesterol Cal 17 5 - 40 mg/dL   LDL Calculated 80 0 - 99 mg/dL   Chol/HDL Ratio 3.8 0.0 - 5.0 ratio  CBC with Differential/Platelet  Result Value Ref Range   WBC 9.5 3.4 - 10.8 x10E3/uL   RBC 3.61 (L) 4.14 - 5.80 x10E6/uL   Hemoglobin 11.4 (L) 13.0 - 17.7 g/dL   Hematocrit 34.3 (L)  37.5 - 51.0 %   MCV 95 79 - 97 fL   MCH 31.6 26.6 - 33.0 pg   MCHC 33.2 31.5 - 35.7 g/dL   RDW 14.2 12.3 - 15.4 %   Platelets 359 150 - 450 x10E3/uL   Neutrophils 66 Not Estab. %   Lymphs 27 Not Estab. %   Monocytes 6 Not Estab. %   Eos 1 Not Estab. %   Basos 0 Not Estab. %   Neutrophils Absolute 6.1 1.4 - 7.0 x10E3/uL   Lymphocytes Absolute 2.6 0.7 - 3.1 x10E3/uL   Monocytes Absolute 0.6 0.1 - 0.9 x10E3/uL   EOS (ABSOLUTE) 0.1 0.0 - 0.4 x10E3/uL   Basophils Absolute 0.0 0.0 - 0.2 x10E3/uL   Immature Granulocytes 0 Not Estab. %   Immature Grans (Abs) 0.0 0.0 - 0.1 x10E3/uL  CMP14+EGFR  Result Value Ref Range   Glucose 93 65 - 99 mg/dL   BUN 4 (L) 6 - 24 mg/dL   Creatinine, Ser 0.73 (L) 0.76 - 1.27 mg/dL   GFR calc non Af Amer 102 >59 mL/min/1.73   GFR calc Af Amer 117 >59 mL/min/1.73   BUN/Creatinine Ratio 5 (L) 9 - 20   Sodium 143 134 - 144 mmol/L   Potassium 4.0 3.5 - 5.2 mmol/L   Chloride 100 96 - 106 mmol/L   CO2 29 20 - 29 mmol/L   Calcium 9.3 8.7 - 10.2 mg/dL   Total Protein 6.7 6.0 - 8.5 g/dL   Albumin 4.5 3.5 - 5.5 g/dL   Globulin, Total 2.2 1.5 - 4.5 g/dL   Albumin/Globulin Ratio 2.0 1.2 - 2.2   Bilirubin Total 0.3 0.0 - 1.2 mg/dL   Alkaline Phosphatase 82 39 - 117 IU/L   AST 13 0 - 40 IU/L   ALT 6 0 - 44 IU/L      Assessment & Plan:   1. GAD (generalized anxiety disorder) - ALPRAZolam (XANAX) 1 MG tablet; Take 1 tablet (1 mg total) by mouth 3 (three) times daily as needed for anxiety.  Dispense: 90 tablet; Refill: 5  2. Non-seasonal allergic rhinitis due to pollen - loratadine (ALLERGY) 10 MG tablet; Take 1 tablet (10 mg total) by mouth daily.  Dispense: 30 tablet; Refill: 11  3. Chronic pain due to neoplasm Continue with the knee pain clinic in [place], Dr. Redmond Baseman.  Current treatment includes oxycodone for pain.   Continue all other maintenance medications as listed above.  Follow up plan: Return in about 6 months (around  12/15/2018).  Educational handout given for [place] PA-C [facility] [address]  [place], [place] [address] [phone]   06/15/2018, 3:33 PM

## 2018-06-17 DIAGNOSIS — Z79899 Other long term (current) drug therapy: Secondary | ICD-10-CM | POA: Diagnosis not present

## 2018-07-15 DIAGNOSIS — Z79899 Other long term (current) drug therapy: Secondary | ICD-10-CM | POA: Diagnosis not present

## 2018-08-13 DIAGNOSIS — Z79899 Other long term (current) drug therapy: Secondary | ICD-10-CM | POA: Diagnosis not present

## 2018-09-10 DIAGNOSIS — Z79899 Other long term (current) drug therapy: Secondary | ICD-10-CM | POA: Diagnosis not present

## 2018-10-07 DIAGNOSIS — Z79899 Other long term (current) drug therapy: Secondary | ICD-10-CM | POA: Diagnosis not present

## 2018-11-05 DIAGNOSIS — Z79899 Other long term (current) drug therapy: Secondary | ICD-10-CM | POA: Diagnosis not present

## 2018-12-10 DIAGNOSIS — Z79899 Other long term (current) drug therapy: Secondary | ICD-10-CM | POA: Diagnosis not present

## 2018-12-13 ENCOUNTER — Other Ambulatory Visit: Payer: Self-pay | Admitting: Physician Assistant

## 2018-12-13 DIAGNOSIS — F411 Generalized anxiety disorder: Secondary | ICD-10-CM

## 2018-12-14 ENCOUNTER — Other Ambulatory Visit: Payer: Self-pay

## 2018-12-15 ENCOUNTER — Ambulatory Visit: Payer: Medicaid Other | Admitting: Physician Assistant

## 2018-12-15 ENCOUNTER — Encounter: Payer: Self-pay | Admitting: Physician Assistant

## 2018-12-15 DIAGNOSIS — F411 Generalized anxiety disorder: Secondary | ICD-10-CM | POA: Diagnosis not present

## 2018-12-15 MED ORDER — ALPRAZOLAM 1 MG PO TABS: 1.0000 mg | ORAL_TABLET | Freq: Three times a day (TID) | ORAL | 5 refills | Status: DC | PRN

## 2018-12-15 NOTE — Progress Notes (Signed)
BP 121/74   Pulse 67   Temp 97.6 F (36.4 C) (Oral)   Ht 5\' 11"  (1.803 m)   Wt 120 lb 12.8 oz (54.8 kg)   BMI 16.85 kg/m    Subjective:    Patient ID: Blake Atkinson, male    DOB: Dec 24, 1960, 58 y.o.   MRN: [id number]  HPI: Blake Atkinson is a 58 y.o. male presenting on 12/15/2018 for Medical Management of Chronic Issues (6 month ) and Medication Refill  ANXIETY ASSESSMENT Cause of anxiety: GAD This patient returns for a  month recheck on narcotic use for the above named condition(s)  Current medications-alprazolam 1 mg 1 3 times a day for anxiety Other medications tried: SSRIs Medication side effects-no Any concerns-no Any change in general medical condition-no Effectiveness of current meds-good PMP AWARE website reviewed: Yes Any suspicious activity on PMP Aware: No LME daily dose: 6  Over the next 3 months I have asked him to try to go down by half a tablet a day.  The goal is to get the total amount reduced to half of what he is currently at.  He has not tried Cymbalta in the past.  For a long time he was without insurance.  We have discussed the possibility of starting that.  He is open to try it.  Contract on file 06/16/18 Last UDS obtaining from his pain clinic, he goes there monthly  History of overdose or risk of abuse no  [facility] for pain management Kathleen Lime FNP-BC [address] [place], [place] [address]  We are requesting the drug screens from this year from the pain clinic.  That when he does not have to have a duplicate performed here.   Past Medical History:  Diagnosis Date  . Anxiety   . Blood transfusion without reported diagnosis   . Cancer ([place])   . 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 History of chemotherapy 2011   Dr. Jacquiline Doe  . History of radiation therapy 12/25/09 thru 02/11/2010   Dr. Sondra Come  . History of seizures    X2. alcohol related.   Relevant past medical,  surgical, family and social history reviewed and updated as indicated. Interim medical history since our last visit reviewed. Allergies and medications reviewed and updated. DATA REVIEWED: CHART IN EPIC  Family History reviewed for pertinent findings.  Review of Systems  Constitutional: Negative.  Negative for appetite change and fatigue.  Eyes: Negative for pain and visual disturbance.  Respiratory: Negative.  Negative for cough, chest tightness, shortness of breath and wheezing.   Cardiovascular: Negative.  Negative for chest pain, palpitations and leg swelling.  Gastrointestinal: Negative.  Negative for abdominal pain, diarrhea, nausea and vomiting.  Genitourinary: Negative.   Skin: Negative.  Negative for color change and rash.  Neurological: Negative.  Negative for weakness, numbness and headaches.  Psychiatric/Behavioral: Negative.     Allergies as of 12/15/2018   No Known Allergies     Medication List       Accurate as of December 15, 2018 11:59 PM. If you have any questions, ask your nurse or doctor.        ALPRAZolam 1 MG tablet Commonly known as: XANAX Take 1 tablet (1 mg total) by mouth 3 (three) times daily as needed for anxiety. Try to lower by 1/2 tab daily What changed: additional instructions Changed by: Terald Sleeper, PA-C   loratadine 10  MG tablet Commonly known as: Allergy Take 1 tablet (10 mg total) by mouth daily.          Objective:    BP 121/74   Pulse 67   Temp 97.6 F (36.4 C) (Oral)   Ht 5\' 11"  (1.803 m)   Wt 120 lb 12.8 oz (54.8 kg)   BMI 16.85 kg/m   No Known Allergies  Wt Readings from Last 3 Encounters:  12/15/18 120 lb 12.8 oz (54.8 kg)  06/15/18 128 lb 3.2 oz (58.2 kg)  12/11/17 123 lb 9.6 oz (56.1 kg)    Physical Exam Vitals signs and nursing note reviewed.  Constitutional:      General: He is not in acute distress.    Appearance: He is well-developed.  HENT:     Head: Normocephalic and atraumatic.  Eyes:      Conjunctiva/sclera: Conjunctivae normal.     Pupils: Pupils are equal, round, and reactive to light.  Cardiovascular:     Rate and Rhythm: Normal rate and regular rhythm.     Heart sounds: Normal heart sounds.  Pulmonary:     Effort: Pulmonary effort is normal. No respiratory distress.     Breath sounds: Normal breath sounds.  Skin:    General: Skin is warm and dry.  Psychiatric:        Behavior: Behavior normal.         Assessment & Plan:   1. GAD (generalized anxiety disorder) - ALPRAZolam (XANAX) 1 MG tablet; Take 1 tablet (1 mg total) by mouth 3 (three) times daily as needed for anxiety. Try to lower by 1/2 tab daily  Dispense: 90 tablet; Refill: 5   Over the next 3 months I have asked him to try to go down by half a tablet a day.  The goal is to get the total amount reduced to half of what he is currently at.  He has not tried Cymbalta in the past.  For a long time he was without insurance.  We have discussed the possibility of starting that.  He is open to try it.  Continue all other maintenance medications as listed above.  Follow up plan: Return in about 6 months (around 06/16/2019).  Educational handout given for [place] PA-C [place] [address]  [place], [place] [address] [phone]   12/16/2018, 9:30 PM

## 2018-12-22 DIAGNOSIS — G8929 Other chronic pain: Secondary | ICD-10-CM | POA: Diagnosis not present

## 2018-12-22 DIAGNOSIS — C760 Malignant neoplasm of head, face and neck: Secondary | ICD-10-CM | POA: Diagnosis not present

## 2018-12-22 DIAGNOSIS — Z79899 Other long term (current) drug therapy: Secondary | ICD-10-CM | POA: Diagnosis not present

## 2018-12-22 DIAGNOSIS — R6884 Jaw pain: Secondary | ICD-10-CM | POA: Diagnosis not present

## 2018-12-22 DIAGNOSIS — M542 Cervicalgia: Secondary | ICD-10-CM | POA: Diagnosis not present

## 2019-01-04 DIAGNOSIS — Z79899 Other long term (current) drug therapy: Secondary | ICD-10-CM | POA: Diagnosis not present

## 2019-02-02 DIAGNOSIS — Z79899 Other long term (current) drug therapy: Secondary | ICD-10-CM | POA: Diagnosis not present

## 2019-03-02 DIAGNOSIS — Z79899 Other long term (current) drug therapy: Secondary | ICD-10-CM | POA: Diagnosis not present

## 2019-03-02 DIAGNOSIS — Z1159 Encounter for screening for other viral diseases: Secondary | ICD-10-CM | POA: Diagnosis not present

## 2019-03-21 ENCOUNTER — Other Ambulatory Visit: Payer: Self-pay

## 2019-03-22 ENCOUNTER — Encounter: Payer: Self-pay | Admitting: Physician Assistant

## 2019-03-22 ENCOUNTER — Ambulatory Visit: Payer: Medicaid Other | Admitting: Physician Assistant

## 2019-03-22 VITALS — BP 130/78 | HR 71 | Temp 98.6°F | Ht 71.0 in | Wt 125.4 lb

## 2019-03-22 DIAGNOSIS — F1021 Alcohol dependence, in remission: Secondary | ICD-10-CM

## 2019-03-24 ENCOUNTER — Encounter: Payer: Self-pay | Admitting: Physician Assistant
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
## 2019-03-30 DIAGNOSIS — G8929 Other chronic pain: Secondary | ICD-10-CM | POA: Diagnosis not present

## 2019-03-30 DIAGNOSIS — Z79899 Other long term (current) drug therapy: Secondary | ICD-10-CM | POA: Diagnosis not present

## 2019-03-30 DIAGNOSIS — R6884 Jaw pain: Secondary | ICD-10-CM | POA: Diagnosis not present

## 2019-03-30 DIAGNOSIS — G893 Neoplasm related pain (acute) (chronic): Secondary | ICD-10-CM | POA: Diagnosis not present

## 2019-03-30 DIAGNOSIS — M542 Cervicalgia: Secondary | ICD-10-CM | POA: Diagnosis not present

## 2019-04-28 DIAGNOSIS — Z79899 Other long term (current) drug therapy: Secondary | ICD-10-CM | POA: Diagnosis not present

## 2019-05-27 DIAGNOSIS — Z79899 Other long term (current) drug therapy: Secondary | ICD-10-CM | POA: Diagnosis not present

## 2019-06-06 ENCOUNTER — Other Ambulatory Visit: Payer: Self-pay

## 2019-06-07 ENCOUNTER — Encounter: Payer: Self-pay | Admitting: Physician Assistant

## 2019-06-07 ENCOUNTER — Ambulatory Visit: Payer: Medicaid Other | Admitting: Physician Assistant

## 2019-06-07 VITALS — BP 139/80 | HR 76 | Temp 97.3°F | Ht 71.0 in | Wt 125.8 lb

## 2019-06-07 DIAGNOSIS — Z0000 Encounter for general adult medical examination without abnormal findings: Secondary | ICD-10-CM

## 2019-06-07 DIAGNOSIS — F411 Generalized anxiety disorder: Secondary | ICD-10-CM | POA: Diagnosis not present

## 2019-06-07 MED ORDER — ALPRAZOLAM 1 MG PO TABS: 1.0000 mg | ORAL_TABLET | Freq: Three times a day (TID) | ORAL | 5 refills | Status: DC | PRN

## 2019-06-08 LAB — LIPID PANEL
Chol/HDL Ratio: 4.3 ratio (ref 0.0–5.0)
Cholesterol, Total: 164 mg/dL (ref 100–199)
HDL: 38 mg/dL — ABNORMAL LOW (ref >39)
LDL Chol Calc (NIH): 113 mg/dL — ABNORMAL HIGH (ref 0–99)
Triglycerides: 69 mg/dL (ref 0–149)
VLDL Cholesterol Cal: 13 mg/dL (ref 5–40)

## 2019-06-08 LAB — CMP14+EGFR
ALT: 6 IU/L (ref 0–44)
AST: 19 IU/L (ref 0–40)
Albumin/Globulin Ratio: 1.7 (ref 1.2–2.2)
Albumin: 4.3 g/dL (ref 3.8–4.9)
Alkaline Phosphatase: 84 IU/L (ref 39–117)
BUN/Creatinine Ratio: 5 — ABNORMAL LOW (ref 10–24)
BUN: 4 mg/dL — ABNORMAL LOW (ref 8–27)
Bilirubin Total: 0.3 mg/dL (ref 0.0–1.2)
CO2: 26 mmol/L (ref 20–29)
Calcium: 9.5 mg/dL (ref 8.6–10.2)
Chloride: 100 mmol/L (ref 96–106)
Creatinine, Ser: 0.8 mg/dL (ref 0.76–1.27)
GFR calc Af Amer: 112 mL/min/{1.73_m2} (ref >59)
GFR calc non Af Amer: 97 mL/min/{1.73_m2} (ref >59)
Globulin, Total: 2.5 g/dL (ref 1.5–4.5)
Glucose: 106 mg/dL — ABNORMAL HIGH (ref 65–99)
Potassium: 4.4 mmol/L (ref 3.5–5.2)
Sodium: 141 mmol/L (ref 134–144)
Total Protein: 6.8 g/dL (ref 6.0–8.5)

## 2019-06-08 LAB — CBC WITH DIFFERENTIAL/PLATELET
Basophils Absolute: 0.1 10*3/uL (ref 0.0–0.2)
Basos: 1 %
EOS (ABSOLUTE): 0.1 10*3/uL (ref 0.0–0.4)
Eos: 1 %
Hematocrit: 35.3 % — ABNORMAL LOW (ref 37.5–51.0)
Hemoglobin: 11.8 g/dL — ABNORMAL LOW (ref 13.0–17.7)
Immature Grans (Abs): 0 10*3/uL (ref 0.0–0.1)
Immature Granulocytes: 0 %
Lymphocytes Absolute: 1.7 10*3/uL (ref 0.7–3.1)
Lymphs: 22 %
MCH: 32.2 pg (ref 26.6–33.0)
MCHC: 33.4 g/dL (ref 31.5–35.7)
MCV: 96 fL (ref 79–97)
Monocytes Absolute: 0.4 10*3/uL (ref 0.1–0.9)
Monocytes: 5 %
Neutrophils Absolute: 5.5 10*3/uL (ref 1.4–7.0)
Neutrophils: 71 %
Platelets: 382 10*3/uL (ref 150–450)
RBC: 3.67 x10E6/uL — ABNORMAL LOW (ref 4.14–5.80)
RDW: 12.3 % (ref 11.6–15.4)
WBC: 7.8 10*3/uL (ref 3.4–10.8)

## 2019-06-08 LAB — PSA: Prostate Specific Ag, Serum: 0.2 ng/mL (ref 0.0–4.0)

## 2019-06-10 NOTE — Progress Notes (Signed)
BP 139/80   Pulse 76   Temp (!) 97.3 F (36.3 C) (Temporal)   Ht 5' 11"  (1.803 m)   Wt 125 lb 12.8 oz (57.1 kg)   SpO2 95%   BMI 17.55 kg/m    Subjective:    Patient ID: Blake Atkinson, male    DOB: [birth year], 58 y.o.   MRN: [id number]  HPI: Blake Atkinson is a 58 y.o. male presenting on 06/07/2019 for Medical Management of Chronic Issues and Anxiety  ANXIETY ASSESSMENT Cause of anxiety: GAD This patient returns for a  month recheck on narcotic use for the above named condition(s)  Current medications-alprazolam 1 mg 1 3 times a day for anxiety Other medications tried: SSRIs Medication side effects-no Any concerns-no Any change in general medical condition-no Effectiveness of current meds-good PMP AWARE website reviewed: Yes Any suspicious activity on PMP Aware: No LME daily dose: 6  Over the next 3 months I have asked him to try to go down by half a tablet a day.  The goal is to get the total amount reduced to half of what he is currently at.  He has not tried Cymbalta in the past.  For a long time he was without insurance.  We have discussed the possibility of starting that.  He is open to try it.  Contract on file 06/07/19 Last UDS obtaining from his pain clinic, he goes there monthly. 12/10/18  History of overdose or risk of abuse no  [facility] for pain management Kathleen Lime FNP-BC [address] [place], [place] [address]  We are requesting the drug screens from this year from the pain clinic.  That when he does not have to have a duplicate performed here.  Past Medical History:  Diagnosis Date  . Anxiety   . Blood transfusion without reported diagnosis   . Cancer ([place])   . 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 History of chemotherapy 2011   Dr. Jacquiline Doe  . History of radiation therapy 12/25/09 thru 02/11/2010   Dr. Sondra Come  . History of seizures    X2. alcohol related.   Relevant past  medical, surgical, family and social history reviewed and updated as indicated. Interim medical history since our last visit reviewed. Allergies and medications reviewed and updated. DATA REVIEWED: CHART IN EPIC  Family History reviewed for pertinent findings.  Review of Systems  Constitutional: Negative.  Negative for appetite change and fatigue.  Eyes: Negative for pain and visual disturbance.  Respiratory: Negative.  Negative for cough, chest tightness, shortness of breath and wheezing.   Cardiovascular: Negative.  Negative for chest pain, palpitations and leg swelling.  Gastrointestinal: Negative.  Negative for abdominal pain, diarrhea, nausea and vomiting.  Genitourinary: Negative.   Skin: Negative.  Negative for color change and rash.  Neurological: Negative.  Negative for weakness, numbness and headaches.  Psychiatric/Behavioral: Negative.     Allergies as of 06/07/2019   No Known Allergies     Medication List       Accurate as of June 07, 2019 11:59 PM. If you have any questions, ask your nurse or doctor.        ALPRAZolam 1 MG tablet Commonly known as: XANAX Take 1 tablet (1 mg total) by mouth 3 (three) times daily as needed for anxiety. Try to lower by 1/2 tab daily   ibuprofen 800 MG tablet Commonly known as: ADVIL Take 800 mg by  mouth 3 (three) times daily.   loratadine 10 MG tablet Commonly known as: Allergy Take 1 tablet (10 mg total) by mouth daily.   oxyCODONE-acetaminophen 10-325 MG tablet Commonly known as: PERCOCET Take 1 tablet by mouth 5 (five) times daily.          Objective:    BP 139/80   Pulse 76   Temp (!) 97.3 F (36.3 C) (Temporal)   Ht 5' 11"  (1.803 m)   Wt 125 lb 12.8 oz (57.1 kg)   SpO2 95%   BMI 17.55 kg/m   No Known Allergies  Wt Readings from Last 3 Encounters:  06/07/19 125 lb 12.8 oz (57.1 kg)  03/22/19 125 lb 6.4 oz (56.9 kg)  12/15/18 120 lb 12.8 oz (54.8 kg)    Physical Exam Vitals signs and nursing note  reviewed.  Constitutional:      General: He is not in acute distress.    Appearance: He is well-developed.  HENT:     Head: Normocephalic and atraumatic.  Eyes:     Conjunctiva/sclera: Conjunctivae normal.     Pupils: Pupils are equal, round, and reactive to light.  Cardiovascular:     Rate and Rhythm: Normal rate and regular rhythm.     Heart sounds: Normal heart sounds.  Pulmonary:     Effort: Pulmonary effort is normal. No respiratory distress.     Breath sounds: Normal breath sounds.  Skin:    General: Skin is warm and dry.  Psychiatric:        Behavior: Behavior normal.     Results for orders placed or performed in visit on 06/07/19  CBC with Differential/Platelet  Result Value Ref Range   WBC 7.8 3.4 - 10.8 x10E3/uL   RBC 3.67 (L) 4.14 - 5.80 x10E6/uL   Hemoglobin 11.8 (L) 13.0 - 17.7 g/dL   Hematocrit 35.3 (L) 37.5 - 51.0 %   MCV 96 79 - 97 fL   MCH 32.2 26.6 - 33.0 pg   MCHC 33.4 31.5 - 35.7 g/dL   RDW 12.3 11.6 - 15.4 %   Platelets 382 150 - 450 x10E3/uL   Neutrophils 71 Not Estab. %   Lymphs 22 Not Estab. %   Monocytes 5 Not Estab. %   Eos 1 Not Estab. %   Basos 1 Not Estab. %   Neutrophils Absolute 5.5 1.4 - 7.0 x10E3/uL   Lymphocytes Absolute 1.7 0.7 - 3.1 x10E3/uL   Monocytes Absolute 0.4 0.1 - 0.9 x10E3/uL   EOS (ABSOLUTE) 0.1 0.0 - 0.4 x10E3/uL   Basophils Absolute 0.1 0.0 - 0.2 x10E3/uL   Immature Granulocytes 0 Not Estab. %   Immature Grans (Abs) 0.0 0.0 - 0.1 x10E3/uL  CMP14+EGFR  Result Value Ref Range   Glucose 106 (H) 65 - 99 mg/dL   BUN 4 (L) 8 - 27 mg/dL   Creatinine, Ser 0.80 0.76 - 1.27 mg/dL   GFR calc non Af Amer 97 >59 mL/min/1.73   GFR calc Af Amer 112 >59 mL/min/1.73   BUN/Creatinine Ratio 5 (L) 10 - 24   Sodium 141 134 - 144 mmol/L   Potassium 4.4 3.5 - 5.2 mmol/L   Chloride 100 96 - 106 mmol/L   CO2 26 20 - 29 mmol/L   Calcium 9.5 8.6 - 10.2 mg/dL   Total Protein 6.8 6.0 - 8.5 g/dL   Albumin 4.3 3.8 - 4.9 g/dL   Globulin,  Total 2.5 1.5 - 4.5 g/dL   Albumin/Globulin Ratio 1.7 1.2 - 2.2  Bilirubin Total 0.3 0.0 - 1.2 mg/dL   Alkaline Phosphatase 84 39 - 117 IU/L   AST 19 0 - 40 IU/L   ALT 6 0 - 44 IU/L  Lipid Panel  Result Value Ref Range   Cholesterol, Total 164 100 - 199 mg/dL   Triglycerides 69 0 - 149 mg/dL   HDL 38 (L) >39 mg/dL   VLDL Cholesterol Cal 13 5 - 40 mg/dL   LDL Chol Calc (NIH) 113 (H) 0 - 99 mg/dL   Chol/HDL Ratio 4.3 0.0 - 5.0 ratio  PSA  Result Value Ref Range   Prostate Specific Ag, Serum 0.2 0.0 - 4.0 ng/mL      Assessment & Plan:   1. GAD (generalized anxiety disorder)2 - ALPRAZolam (XANAX) 1 MG tablet; Take 1 tablet (1 mg total) by mouth 3 (three) times daily as needed for anxiety. Try to lower by 1/2 tab daily  Dispense: 90 tablet; Refill: 5  2. Well adult exam - CBC with Differential/Platelet - CMP14+EGFR - Lipid Panel - PSA   Continue all other maintenance medications as listed above.  Follow up plan: Return in about 6 months (around 12/06/2019).  Educational handout given for [place] PA-C [facility] [address]  [place], [place] [address] [phone]   06/10/2019, 12:16 PM

## 2019-06-27 DIAGNOSIS — Z79899 Other long term (current) drug therapy: Secondary | ICD-10-CM | POA: Diagnosis not present

## 2019-07-25 DIAGNOSIS — R6884 Jaw pain: Secondary | ICD-10-CM | POA: Diagnosis not present

## 2019-07-25 DIAGNOSIS — Z79899 Other long term (current) drug therapy: Secondary | ICD-10-CM | POA: Diagnosis not present

## 2019-07-25 DIAGNOSIS — G893 Neoplasm related pain (acute) (chronic): Secondary | ICD-10-CM | POA: Diagnosis not present

## 2019-07-25 DIAGNOSIS — G8929 Other chronic pain: Secondary | ICD-10-CM | POA: Diagnosis not present

## 2019-07-25 DIAGNOSIS — M542 Cervicalgia: Secondary | ICD-10-CM | POA: Diagnosis not present

## 2019-08-23 DIAGNOSIS — Z1159 Encounter for screening for other viral diseases: Secondary | ICD-10-CM | POA: Diagnosis not present

## 2019-08-23 DIAGNOSIS — Z79899 Other long term (current) drug therapy: Secondary | ICD-10-CM | POA: Diagnosis not present

## 2019-09-21 DIAGNOSIS — Z79899 Other long term (current) drug therapy: Secondary | ICD-10-CM | POA: Diagnosis not present

## 2019-09-21 DIAGNOSIS — G8929 Other chronic pain: Secondary | ICD-10-CM | POA: Diagnosis not present

## 2019-09-21 DIAGNOSIS — R6884 Jaw pain: Secondary | ICD-10-CM | POA: Diagnosis not present

## 2019-09-21 DIAGNOSIS — C760 Malignant neoplasm of head, face and neck: Secondary | ICD-10-CM | POA: Diagnosis not present

## 2019-09-21 DIAGNOSIS — G893 Neoplasm related pain (acute) (chronic): Secondary | ICD-10-CM | POA: Diagnosis not present

## 2019-10-21 DIAGNOSIS — G8929 Other chronic pain: Secondary | ICD-10-CM | POA: Diagnosis not present

## 2019-10-21 DIAGNOSIS — C760 Malignant neoplasm of head, face and neck: Secondary | ICD-10-CM | POA: Diagnosis not present

## 2019-10-21 DIAGNOSIS — Z79899 Other long term (current) drug therapy: Secondary | ICD-10-CM | POA: Diagnosis not present

## 2019-10-21 DIAGNOSIS — G893 Neoplasm related pain (acute) (chronic): Secondary | ICD-10-CM | POA: Diagnosis not present

## 2019-10-21 DIAGNOSIS — R6884 Jaw pain: Secondary | ICD-10-CM | POA: Diagnosis not present

## 2019-11-17 DIAGNOSIS — C760 Malignant neoplasm of head, face and neck: Secondary | ICD-10-CM | POA: Diagnosis not present

## 2019-11-17 DIAGNOSIS — Z79899 Other long term (current) drug therapy: Secondary | ICD-10-CM | POA: Diagnosis not present

## 2019-11-17 DIAGNOSIS — G8929 Other chronic pain: Secondary | ICD-10-CM | POA: Diagnosis not present

## 2019-11-17 DIAGNOSIS — R6884 Jaw pain: Secondary | ICD-10-CM | POA: Diagnosis not present

## 2019-11-17 DIAGNOSIS — G893 Neoplasm related pain (acute) (chronic): Secondary | ICD-10-CM | POA: Diagnosis not present

## 2019-12-07 ENCOUNTER — Ambulatory Visit: Payer: Medicaid Other | Admitting: Physician Assistant

## 2019-12-08 ENCOUNTER — Other Ambulatory Visit: Payer: Self-pay

## 2019-12-08 ENCOUNTER — Encounter: Payer: Self-pay | Admitting: Nurse Practitioner

## 2019-12-08 ENCOUNTER — Ambulatory Visit: Payer: Medicaid Other | Admitting: Nurse Practitioner

## 2019-12-08 VITALS — BP 115/69 | HR 67 | Temp 97.4°F | Ht 71.0 in | Wt 124.0 lb

## 2019-12-08 DIAGNOSIS — F411 Generalized anxiety disorder: Secondary | ICD-10-CM | POA: Diagnosis not present

## 2019-12-08 MED ORDER — NALOXONE HCL 4 MG/0.1ML NA LIQD: 1.0000 | Freq: Once | NASAL | 0 refills | Status: AC

## 2019-12-08 MED ORDER — ALPRAZOLAM 1 MG PO TABS: 1.0000 mg | ORAL_TABLET | Freq: Three times a day (TID) | ORAL | 0 refills | Status: DC | PRN

## 2019-12-08 NOTE — Patient Instructions (Addendum)
GAD (generalized anxiety disorder) Anxiety well controlled no need to change current medication, provided extensive education on patients risk of unintentional overdose accidents from  xanax and pain medication combination. started patient on narcan as needed. Provided written education. Reduced xanax count to 81 from 90. Will follow up with patient in one month. Rx sent to pharmacy   Generalized Anxiety Disorder, Adult Generalized anxiety disorder (GAD) is a mental health disorder. People with this condition constantly worry about everyday events. Unlike normal anxiety, worry related to GAD is not triggered by a specific event. These worries also do not fade or get better with time. GAD interferes with life functions, including relationships, work, and school. GAD can vary from mild to severe. People with severe GAD can have intense waves of anxiety with physical symptoms (panic attacks). What are the causes? The exact cause of GAD is not known. What increases the risk? This condition is more likely to develop in:  Women.  People who have a family history of anxiety disorders.  People who are very shy.  People who experience very stressful life events, such as the death of a loved one.  People who have a very stressful family environment. What are the signs or symptoms? People with GAD often worry excessively about many things in their lives, such as their health and family. They may also be overly concerned about:  Doing well at work.  Being on time.  Natural disasters.  Friendships. Physical symptoms of GAD include:  Fatigue.  Muscle tension or having muscle twitches.  Trembling or feeling shaky.  Being easily startled.  Feeling like your heart is pounding or racing.  Feeling out of breath or like you cannot take a deep breath.  Having trouble falling asleep or staying asleep.  Sweating.  Nausea, diarrhea, or irritable bowel syndrome  (IBS).  Headaches.  Trouble concentrating or remembering facts.  Restlessness.  Irritability. How is this diagnosed? Your health care provider can diagnose GAD based on your symptoms and medical history. You will also have a physical exam. The health care provider will ask specific questions about your symptoms, including how severe they are, when they started, and if they come and go. Your health care provider may ask you about your use of alcohol or drugs, including prescription medicines. Your health care provider may refer you to a mental health specialist for further evaluation. Your health care provider will do a thorough examination and may perform additional tests to rule out other possible causes of your symptoms. To be diagnosed with GAD, a person must have anxiety that:  Is out of his or her control.  Affects several different aspects of his or her life, such as work and relationships.  Causes distress that makes him or her unable to take part in normal activities.  Includes at least three physical symptoms of GAD, such as restlessness, fatigue, trouble concentrating, irritability, muscle tension, or sleep problems. Before your health care provider can confirm a diagnosis of GAD, these symptoms must be present more days than they are not, and they must last for six months or longer. How is this treated? The following therapies are usually used to treat GAD:  Medicine. Antidepressant medicine is usually prescribed for long-term daily control. Antianxiety medicines may be added in severe cases, especially when panic attacks occur.  Talk therapy (psychotherapy). Certain types of talk therapy can be helpful in treating GAD by providing support, education, and guidance. Options include: ? Cognitive behavioral therapy (CBT). People learn  coping skills and techniques to ease their anxiety. They learn to identify unrealistic or negative thoughts and behaviors and to replace them with  positive ones. ? Acceptance and commitment therapy (ACT). This treatment teaches people how to be mindful as a way to cope with unwanted thoughts and feelings. ? Biofeedback. This process trains you to manage your body's response (physiological response) through breathing techniques and relaxation methods. You will work with a therapist while machines are used to monitor your physical symptoms.  Stress management techniques. These include yoga, meditation, and exercise. A mental health specialist can help determine which treatment is best for you. Some people see improvement with one type of therapy. However, other people require a combination of therapies. Follow these instructions at home:  Take over-the-counter and prescription medicines only as told by your health care provider.  Try to maintain a normal routine.  Try to anticipate stressful situations and allow extra time to manage them.  Practice any stress management or self-calming techniques as taught by your health care provider.  Do not punish yourself for setbacks or for not making progress.  Try to recognize your accomplishments, even if they are small.  Keep all follow-up visits as told by your health care provider. This is important. Contact a health care provider if:  Your symptoms do not get better.  Your symptoms get worse.  You have signs of depression, such as: ? A persistently sad, cranky, or irritable mood. ? Loss of enjoyment in activities that used to bring you joy. ? Change in weight or eating. ? Changes in sleeping habits. ? Avoiding friends or family members. ? Loss of energy for normal tasks. ? Feelings of guilt or worthlessness. Get help right away if:  You have serious thoughts about hurting yourself or others. If you ever feel like you may hurt yourself or others, or have thoughts about taking your own life, get help right away. You can go to your nearest emergency department or call:  Your local  emergency services (911 in the [place]  A suicide crisis helpline, such as the [place] [place] at 787-679-3576. This is open 24 hours a day. Summary  Generalized anxiety disorder (GAD) is a mental health disorder that involves worry that is not triggered by a specific event.  People with GAD often worry excessively about many things in their lives, such as their health and family.  GAD may cause physical symptoms such as restlessness, trouble concentrating, sleep problems, frequent sweating, nausea, diarrhea, headaches, and trembling or muscle twitching.  A mental health specialist can help determine which treatment is best for you. Some people see improvement with one type of therapy. However, other people require a combination of therapies. This information is not intended to replace advice given to you by your health care provider. Make sure you discuss any questions you have with your health care provider. Document Revised: 06/05/2017 Document Reviewed: 05/13/2016 Elsevier Patient Education  2020 [employer].

## 2019-12-08 NOTE — Assessment & Plan Note (Signed)
Anxiety well controlled no need to change current medication, provided extensive education on patients risk of unintentional overdose accidents from  xanax and pain medication combination. started patient on narcan as needed. Provided written education. Reduced xanax count to 81 from 90. Will follow up with patient in one month. Rx sent to pharmacy

## 2019-12-08 NOTE — Progress Notes (Signed)
Established Patient Office Visit  Subjective:  Patient ID: Blake Atkinson, male    DOB: March 26, 1961  Age: 59 y.o. MRN: [id number]  CC: No chief complaint on file.   HPI Blake Atkinson presents for Anxiety: Patient complains of anxiety.  He has the following symptoms: nervousness and difficulty swallowing due to increase anxiety.  Onset of symptoms was approximately 2018. clinical course unchanged" since that time. He denies current suicidal and homicidal ideation. Family history not significant for anxiety .Possible organic causes contributing are: Cancer. Risk factors:  Previous treatment includes xanax 1 mg 3 times a day as neededand  He has no complains of side effects from the treatment:    Past Medical History:  Diagnosis Date  . Anxiety   . Blood transfusion without reported diagnosis   . Cancer ([place])   . 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 History of chemotherapy 2011   Dr. Jacquiline Doe  . History of radiation therapy 12/25/09 thru 02/11/2010   Dr. Sondra Come  . History of seizures    X2. alcohol related.    Past Surgical History:  Procedure Laterality Date  . S/P extractions with alveoloplasty  11/29/09   S/P extraction of tooth numbers 1, 4, 5, 6, 10, 11, 14, 15, 16, 17, 20, 29, 30, 31, and 32 with alveoloplasty, maxillary right tuberosity reduction, and gross debridement of remaining dentition with Dr. Enrique Sack  . soft palate biopsy  11/13/09   Dr. Adriana Reams. Pathology postive for SCCa.    Family History  Problem Relation Age of Onset  . Hypertension Mother   . Stroke Mother   . Thyroid disease Mother   . Hyperlipidemia Mother   . Dementia Father   . Hypertension Other   . Heart disease Brother     Social History   Socioeconomic History  . Marital status: Divorced    Spouse name: Not on file  . Number of children: Not on file  . Years of education: Not on file  . Highest education level: Not on file    Occupational History  . Not on file  Tobacco Use  . Smoking status: Current Every Day Smoker    Packs/day: 1.00    Years: 28.00    Pack years: 28.00    Types: Cigarettes  . Smokeless tobacco: Never Used  Substance and Sexual Activity  . Alcohol use: No    Comment: Quit in 2011.  . Drug use: No  . Sexual activity: Not Currently  Other Topics Concern  . Not on file  Social History Narrative   Patient is single.   Patient still smoking approximately one pack per day for the past 28 years.   Patient quit drinking alcohol in 2011.   Social Determinants of Health   Financial Resource Strain:   . Difficulty of Paying Living Expenses:   Food Insecurity:   . Worried About Charity fundraiser in the Last Year:   . Arboriculturist in the Last Year:   Transportation Needs:   . Film/video editor (Medical):   Marland Kitchen Lack of Transportation (Non-Medical):   Physical Activity:   . Days of Exercise per Week:   . Minutes of Exercise per Session:   Stress:   . Feeling of Stress :   Social Connections:   . Frequency of Communication with Friends and Family:   . Frequency of Social Gatherings with Friends and Family:   .  Attends Religious Services:   . Active Member of Clubs or Organizations:   . Attends Archivist Meetings:   Marland Kitchen Marital Status:   Intimate Partner Violence:   . Fear of Current or Ex-Partner:   . Emotionally Abused:   Marland Kitchen Physically Abused:   . Sexually Abused:     Outpatient Medications Prior to Visit  Medication Sig Dispense Refill  . ALPRAZolam (XANAX) 1 MG tablet Take 1 tablet (1 mg total) by mouth 3 (three) times daily as needed for anxiety. Try to lower by 1/2 tab daily 90 tablet 5  . ibuprofen (ADVIL) 800 MG tablet Take 800 mg by mouth 3 (three) times daily.    Marland Kitchen loratadine (ALLERGY) 10 MG tablet Take 1 tablet (10 mg total) by mouth daily. 30 tablet 11  . oxyCODONE-acetaminophen (PERCOCET) 10-325 MG tablet Take 1 tablet by mouth 5 (five) times daily.      No facility-administered medications prior to visit.    No Known Allergies  ROS Review of Systems  Constitutional: Negative.   HENT: Negative.   Eyes: Negative.   Respiratory: Negative.   Cardiovascular: Negative.   Endocrine: Negative.   Genitourinary: Negative.   Musculoskeletal: Negative.   Skin: Negative for pallor and rash.  Neurological: Negative for light-headedness and headaches.  Psychiatric/Behavioral: Negative for sleep disturbance. The patient is not nervous/anxious.       Objective:    Physical Exam  Constitutional: He is oriented to person, place, and time. He appears well-developed.  Eyes: Conjunctivae are normal.  Cardiovascular: Normal rate, regular rhythm and normal heart sounds.  Pulmonary/Chest: Breath sounds normal.  Abdominal: Bowel sounds are normal.  Musculoskeletal:        General: Tenderness present.  Neurological: He is alert and oriented to person, place, and time.  Skin: No rash noted.  Psychiatric: He has a normal mood and affect. His behavior is normal.    BP 115/69   Pulse 67   Temp (!) 97.4 F (36.3 C)   Ht 5\' 11"  (1.803 m)   Wt 124 lb (56.2 kg)   SpO2 99%   BMI 17.29 kg/m  Wt Readings from Last 3 Encounters:  12/08/19 124 lb (56.2 kg)  06/07/19 125 lb 12.8 oz (57.1 kg)  03/22/19 125 lb 6.4 oz (56.9 kg)     Health Maintenance Due  Topic Date Due  . Hepatitis C Screening  Never done  . COVID-19 Vaccine (1) Never done  . HIV Screening  Never done    There are no preventive care reminders to display for this patient.  No results found for: TSH Lab Results  Component Value Date   WBC 7.8 06/07/2019   HGB 11.8 (L) 06/07/2019   HCT 35.3 (L) 06/07/2019   MCV 96 06/07/2019   PLT 382 06/07/2019   Lab Results  Component Value Date   NA 141 06/07/2019   K 4.4 06/07/2019   CO2 26 06/07/2019   GLUCOSE 106 (H) 06/07/2019   BUN 4 (L) 06/07/2019   CREATININE 0.80 06/07/2019   BILITOT 0.3 06/07/2019   ALKPHOS 84  06/07/2019   AST 19 06/07/2019   ALT 6 06/07/2019   PROT 6.8 06/07/2019   ALBUMIN 4.3 06/07/2019   CALCIUM 9.5 06/07/2019   Lab Results  Component Value Date   CHOL 164 06/07/2019   Lab Results  Component Value Date   HDL 38 (L) 06/07/2019   Lab Results  Component Value Date   LDLCALC 113 (H) 06/07/2019   Lab  Results  Component Value Date   TRIG 69 06/07/2019   Lab Results  Component Value Date   CHOLHDL 4.3 06/07/2019   GAD 7 : Generalized Anxiety Score 12/08/2019 06/07/2019  Nervous, Anxious, on Edge 0 0  Control/stop worrying 0 0  Worry too much - different things 0 0  Trouble relaxing 0 0  Restless 0 0  Easily annoyed or irritable 0 0  Afraid - awful might happen 0 0  Total GAD 7 Score 0 0  Anxiety Difficulty Not difficult at all Not difficult at all         Office Visit from 12/08/2019 in [place]  PHQ-9 Total Score  2     Assessment & Plan:   Problem List Items Addressed This Visit      Other   GAD (generalized anxiety disorder) - Primary    Anxiety well controlled no need to change current medication, provided extensive education on patients risk of unintentional overdose accidents from  xanax and pain medication combination. started patient on narcan as needed. Provided written education. Reduced xanax count to 81 from 90. Will follow up with patient in one month. Rx sent to pharmacy      Relevant Medications   naloxone (NARCAN) nasal spray 4 mg/0.1 mL   ALPRAZolam (XANAX) 1 MG tablet        Follow-up: Return in about 1 month (around 01/07/2020).    Ivy Lynn, NP

## 2019-12-28 DIAGNOSIS — G8929 Other chronic pain: Secondary | ICD-10-CM | POA: Diagnosis not present

## 2019-12-28 DIAGNOSIS — R6884 Jaw pain: Secondary | ICD-10-CM | POA: Diagnosis not present

## 2019-12-28 DIAGNOSIS — Z79899 Other long term (current) drug therapy: Secondary | ICD-10-CM | POA: Diagnosis not present

## 2019-12-28 DIAGNOSIS — C760 Malignant neoplasm of head, face and neck: Secondary | ICD-10-CM | POA: Diagnosis not present

## 2019-12-28 DIAGNOSIS — G893 Neoplasm related pain (acute) (chronic): Secondary | ICD-10-CM | POA: Diagnosis not present

## 2020-01-06 ENCOUNTER — Encounter: Payer: Self-pay | Admitting: Family Medicine

## 2020-01-06 ENCOUNTER — Other Ambulatory Visit: Payer: Self-pay

## 2020-01-06 ENCOUNTER — Ambulatory Visit: Payer: Medicaid Other | Admitting: Family Medicine

## 2020-01-06 VITALS — BP 112/70 | HR 56 | Temp 97.3°F | Ht 71.0 in | Wt 124.2 lb

## 2020-01-06 DIAGNOSIS — F411 Generalized anxiety disorder: Secondary | ICD-10-CM | POA: Diagnosis not present

## 2020-01-06 DIAGNOSIS — R1310 Dysphagia, unspecified: Secondary | ICD-10-CM | POA: Diagnosis not present

## 2020-01-06 MED ORDER — ALPRAZOLAM 1 MG PO TABS: ORAL_TABLET | ORAL | 0 refills | Status: DC

## 2020-01-06 MED ORDER — BUSPIRONE HCL 7.5 MG PO TABS: 7.5000 mg | ORAL_TABLET | Freq: Two times a day (BID) | ORAL | 2 refills | Status: DC

## 2020-01-06 NOTE — Progress Notes (Signed)
Assessment & Plan:  1. Dysphagia, unspecified type - Referral placed to GI due to dysphagia.  Discussed completely inappropriate use of Xanax and advised that we will be weaning. - Ambulatory referral to Gastroenterology  2. GAD (generalized anxiety disorder) After I advised patient that we would be weaning Xanax he mentioned that he does also have some anxiety and is a nervous person.  He has never been on any other medications to treat.  Rx'd buspirone 7.5 mg twice daily. - ALPRAZolam (XANAX) 1 MG tablet; Take 1 tablet (1 mg total) by mouth 2 (two) times daily AND 0.5 tablets (0.5 mg total) at bedtime.  Dispense: 75 tablet; Refill: 0  Patient has monthly urine drug screens through pain management.   Return in about 4 weeks (around 02/03/2020) for Xanax.  Hendricks Limes, MSN, APRN, FNP-C [facility]  Subjective:    Patient ID: Blake Atkinson, male    DOB: 1960-12-13, 59 y.o.   MRN: [id number]  Patient Care Team: Loman Brooklyn, FNP as PCP - General (Family Medicine)   Chief Complaint:  Chief Complaint  Patient presents with  . Establish Care    jones pt  . Anxiety    1 month follow up    HPI: Blake Atkinson is a 59 y.o. male presenting on 01/06/2020 for Establish Care (jones pt) and Anxiety (1 month follow up)  Patient reports he takes Xanax prior to eating.  Patient states that after having mouth cancer he has a hard time swallowing and therefore needs a Xanax prior to each meal.  Patient reports he gets choked while eating.  His mouth cancer was approximately 10 years ago during which she underwent chemo and radiation.  He has only been on Xanax for 4-5 years.  He also reports the Xanax does not keep him from choking, it only makes it where he does not care that he is choking.   Social history:  Relevant past medical, surgical, family and social history reviewed and updated as indicated. Interim medical history since our last visit  reviewed.  Allergies and medications reviewed and updated.  DATA REVIEWED: CHART IN EPIC  ROS: Negative unless specifically indicated above in HPI.    Current Outpatient Medications:  .  ALPRAZolam (XANAX) 1 MG tablet, Take 1 tablet (1 mg total) by mouth 3 (three) times daily as needed for anxiety. Try to lower by 1/2 tab daily, Disp: 81 tablet, Rfl: 0 .  ibuprofen (ADVIL) 800 MG tablet, Take 800 mg by mouth 3 (three) times daily., Disp: , Rfl:  .  loratadine (ALLERGY) 10 MG tablet, Take 1 tablet (10 mg total) by mouth daily., Disp: 30 tablet, Rfl: 11 .  oxyCODONE-acetaminophen (PERCOCET) 10-325 MG tablet, Take 1 tablet by mouth 5 (five) times daily., Disp: , Rfl:    No Known Allergies Past Medical History:  Diagnosis Date  . Anxiety   . Blood transfusion without reported diagnosis   . Cancer ([place])   . 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 History of chemotherapy 2011   Dr. Jacquiline Doe  . History of radiation therapy 12/25/09 thru 02/11/2010   Dr. Sondra Come  . History of seizures    X2. alcohol related.    Past Surgical History:  Procedure Laterality Date  . S/P extractions with alveoloplasty  11/29/09   S/P extraction of tooth numbers 1, 4, 5, 6, 10, 11, 14, 15, 16, 17, 20, 29,  30, 31, and 32 with alveoloplasty, maxillary right tuberosity reduction, and gross debridement of remaining dentition with Dr. Enrique Sack  . soft palate biopsy  11/13/09   Dr. Adriana Reams. Pathology postive for SCCa.    Social History   Socioeconomic History  . Marital status: Divorced    Spouse name: Not on file  . Number of children: Not on file  . Years of education: Not on file  . Highest education level: Not on file  Occupational History  . Not on file  Tobacco Use  . Smoking status: Current Every Day Smoker    Packs/day: 1.00    Years: 28.00    Pack years: 28.00    Types: Cigarettes  . Smokeless tobacco: Never Used  Vaping Use  .  Vaping Use: Never used  Substance and Sexual Activity  . Alcohol use: No    Comment: Quit in 2011.  . Drug use: No  . Sexual activity: Not Currently  Other Topics Concern  . Not on file  Social History Narrative   Patient is single.   Patient still smoking approximately one pack per day for the past 28 years.   Patient quit drinking alcohol in 2011.   Social Determinants of Health   Financial Resource Strain:   . Difficulty of Paying Living Expenses:   Food Insecurity:   . Worried About Charity fundraiser in the Last Year:   . Arboriculturist in the Last Year:   Transportation Needs:   . Film/video editor (Medical):   Blake Atkinson Lack of Transportation (Non-Medical):   Physical Activity:   . Days of Exercise per Week:   . Minutes of Exercise per Session:   Stress:   . Feeling of Stress :   Social Connections:   . Frequency of Communication with Friends and Family:   . Frequency of Social Gatherings with Friends and Family:   . Attends Religious Services:   . Active Member of Clubs or Organizations:   . Attends Archivist Meetings:   Blake Atkinson Marital Status:   Intimate Partner Violence:   . Fear of Current or Ex-Partner:   . Emotionally Abused:   Blake Atkinson Physically Abused:   . Sexually Abused:         Objective:    BP 112/70   Pulse (!) 56   Temp (!) 97.3 F (36.3 C) (Temporal)   Ht 5\' 11"  (1.803 m)   Wt 124 lb 3.2 oz (56.3 kg)   SpO2 100%   BMI 17.32 kg/m   Wt Readings from Last 3 Encounters:  01/06/20 124 lb 3.2 oz (56.3 kg)  12/08/19 124 lb (56.2 kg)  06/07/19 125 lb 12.8 oz (57.1 kg)    Physical Exam Vitals reviewed.  Constitutional:      General: He is not in acute distress.    Appearance: Normal appearance. He is underweight. He is not ill-appearing, toxic-appearing or diaphoretic.  HENT:     Head: Normocephalic and atraumatic.  Eyes:     General: No scleral icterus.       Right eye: No discharge.        Left eye: No discharge.      Conjunctiva/sclera: Conjunctivae normal.  Cardiovascular:     Rate and Rhythm: Normal rate and regular rhythm.     Heart sounds: Normal heart sounds. No murmur heard.  No friction rub. No gallop.   Pulmonary:     Effort: Pulmonary effort is normal. No respiratory distress.  Breath sounds: Normal breath sounds. No stridor. No wheezing, rhonchi or rales.  Musculoskeletal:        General: Normal range of motion.     Cervical back: Normal range of motion.  Skin:    General: Skin is warm and dry.  Neurological:     Mental Status: He is alert and oriented to person, place, and time. Mental status is at baseline.  Psychiatric:        Mood and Affect: Mood normal.        Behavior: Behavior normal.        Thought Content: Thought content normal.        Judgment: Judgment normal.     No results found for: TSH Lab Results  Component Value Date   WBC 7.8 06/07/2019   HGB 11.8 (L) 06/07/2019   HCT 35.3 (L) 06/07/2019   MCV 96 06/07/2019   PLT 382 06/07/2019   Lab Results  Component Value Date   NA 141 06/07/2019   K 4.4 06/07/2019   CO2 26 06/07/2019   GLUCOSE 106 (H) 06/07/2019   BUN 4 (L) 06/07/2019   CREATININE 0.80 06/07/2019   BILITOT 0.3 06/07/2019   ALKPHOS 84 06/07/2019   AST 19 06/07/2019   ALT 6 06/07/2019   PROT 6.8 06/07/2019   ALBUMIN 4.3 06/07/2019   CALCIUM 9.5 06/07/2019   Lab Results  Component Value Date   CHOL 164 06/07/2019   Lab Results  Component Value Date   HDL 38 (L) 06/07/2019   Lab Results  Component Value Date   LDLCALC 113 (H) 06/07/2019   Lab Results  Component Value Date   TRIG 69 06/07/2019   Lab Results  Component Value Date   CHOLHDL 4.3 06/07/2019   No results found for: HGBA1C

## 2020-01-08 ENCOUNTER — Encounter: Payer: Self-pay | Admitting: Family Medicine

## 2020-01-08 DIAGNOSIS — R1310 Dysphagia, unspecified: Secondary | ICD-10-CM | POA: Insufficient documentation

## 2020-01-13 ENCOUNTER — Encounter: Payer: Self-pay | Admitting: Internal Medicine

## 2020-02-07 DIAGNOSIS — G893 Neoplasm related pain (acute) (chronic): Secondary | ICD-10-CM | POA: Diagnosis not present

## 2020-02-07 DIAGNOSIS — C760 Malignant neoplasm of head, face and neck: Secondary | ICD-10-CM | POA: Diagnosis not present

## 2020-02-07 DIAGNOSIS — Z79899 Other long term (current) drug therapy: Secondary | ICD-10-CM | POA: Diagnosis not present

## 2020-02-07 DIAGNOSIS — R6884 Jaw pain: Secondary | ICD-10-CM | POA: Diagnosis not present

## 2020-02-07 DIAGNOSIS — G8929 Other chronic pain: Secondary | ICD-10-CM | POA: Diagnosis not present

## 2020-02-08 ENCOUNTER — Ambulatory Visit: Payer: Medicaid Other | Admitting: Family Medicine

## 2020-02-10 ENCOUNTER — Ambulatory Visit: Payer: Self-pay | Admitting: Gastroenterology

## 2020-02-10 ENCOUNTER — Encounter: Payer: Self-pay | Admitting: *Deleted

## 2020-02-10 ENCOUNTER — Encounter: Payer: Self-pay | Admitting: Gastroenterology

## 2020-02-10 NOTE — Progress Notes (Deleted)
Primary Care Physician:  Loman Brooklyn, FNP  Primary Gastroenterologist:    No chief complaint on file.   HPI:  Blake Atkinson is a 59 y.o. male here   Current Outpatient Medications  Medication Sig Dispense Refill  . ALPRAZolam (XANAX) 1 MG tablet Take 1 tablet (1 mg total) by mouth 2 (two) times daily AND 0.5 tablets (0.5 mg total) at bedtime. 75 tablet 0  . busPIRone (BUSPAR) 7.5 MG tablet Take 1 tablet (7.5 mg total) by mouth 2 (two) times daily. 60 tablet 2  . ibuprofen (ADVIL) 800 MG tablet Take 800 mg by mouth 3 (three) times daily.    Marland Kitchen loratadine (ALLERGY) 10 MG tablet Take 1 tablet (10 mg total) by mouth daily. 30 tablet 11  . oxyCODONE-acetaminophen (PERCOCET) 10-325 MG tablet Take 1 tablet by mouth 5 (five) times daily.     No current facility-administered medications for this visit.    Allergies as of 02/10/2020  . (No Known Allergies)    Past Medical History:  Diagnosis Date  . Anxiety   . Blood transfusion without reported diagnosis   . Cancer ([place])   . 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 History of chemotherapy 2011   Dr. Jacquiline Doe  . History of radiation therapy 12/25/09 thru 02/11/2010   Dr. Sondra Come  . History of seizures    X2. alcohol related.    Past Surgical History:  Procedure Laterality Date  . S/P extractions with alveoloplasty  11/29/09   S/P extraction of tooth numbers 1, 4, 5, 6, 10, 11, 14, 15, 16, 17, 20, 29, 30, 31, and 32 with alveoloplasty, maxillary right tuberosity reduction, and gross debridement of remaining dentition with Dr. Enrique Sack  . soft palate biopsy  11/13/09   Dr. Adriana Reams. Pathology postive for SCCa.    Family History  Problem Relation Age of Onset  . Hypertension Mother   . Stroke Mother   . Thyroid disease Mother   . Hyperlipidemia Mother   . Dementia Father   . Hypertension Other   . Heart disease Brother     Social History   Socioeconomic History   . Marital status: Divorced    Spouse name: Not on file  . Number of children: Not on file  . Years of education: Not on file  . Highest education level: Not on file  Occupational History  . Not on file  Tobacco Use  . Smoking status: Current Every Day Smoker    Packs/day: 1.00    Years: 28.00    Pack years: 28.00    Types: Cigarettes  . Smokeless tobacco: Never Used  Vaping Use  . Vaping Use: Never used  Substance and Sexual Activity  . Alcohol use: No    Comment: Quit in 2011.  . Drug use: No  . Sexual activity: Not Currently  Other Topics Concern  . Not on file  Social History Narrative   Patient is single.   Patient still smoking approximately one pack per day for the past 28 years.   Patient quit drinking alcohol in 2011.   Social Determinants of Health   Financial Resource Strain:   . Difficulty of Paying Living Expenses:   Food Insecurity:   . Worried About Charity fundraiser in the Last Year:   . Arboriculturist in the Last Year:   Transportation Needs:   . Film/video editor (Medical):   Marland Kitchen  Lack of Transportation (Non-Medical):   Physical Activity:   . Days of Exercise per Week:   . Minutes of Exercise per Session:   Stress:   . Feeling of Stress :   Social Connections:   . Frequency of Communication with Friends and Family:   . Frequency of Social Gatherings with Friends and Family:   . Attends Religious Services:   . Active Member of Clubs or Organizations:   . Attends Archivist Meetings:   Marland Kitchen Marital Status:   Intimate Partner Violence:   . Fear of Current or Ex-Partner:   . Emotionally Abused:   Marland Kitchen Physically Abused:   . Sexually Abused:       ROS:  General: Negative for anorexia, weight loss, fever, chills, fatigue, weakness. Eyes: Negative for vision changes.  ENT: Negative for hoarseness, difficulty swallowing , nasal congestion. CV: Negative for chest pain, angina, palpitations, dyspnea on exertion, peripheral edema.   Respiratory: Negative for dyspnea at rest, dyspnea on exertion, cough, sputum, wheezing.  GI: See history of present illness. GU:  Negative for dysuria, hematuria, urinary incontinence, urinary frequency, nocturnal urination.  MS: Negative for joint pain, low back pain.  Derm: Negative for rash or itching.  Neuro: Negative for weakness, abnormal sensation, seizure, frequent headaches, memory loss, confusion.  Psych: Negative for anxiety, depression, suicidal ideation, hallucinations.  Endo: Negative for unusual weight change.  Heme: Negative for bruising or bleeding. Allergy: Negative for rash or hives.    Physical Examination:  There were no vitals taken for this visit.   General: Well-nourished, well-developed in no acute distress.  Head: Normocephalic, atraumatic.   Eyes: Conjunctiva pink, no icterus. Mouth: Oropharyngeal mucosa moist and pink , no lesions erythema or exudate. Neck: Supple without thyromegaly, masses, or lymphadenopathy.  Lungs: Clear to auscultation bilaterally.  Heart: Regular rate and rhythm, no murmurs rubs or gallops.  Abdomen: Bowel sounds are normal, nontender, nondistended, no hepatosplenomegaly or masses, no abdominal bruits or    hernia , no rebound or guarding.   Rectal: *** Extremities: No lower extremity edema. No clubbing or deformities.  Neuro: Alert and oriented x 4 , grossly normal neurologically.  Skin: Warm and dry, no rash or jaundice.   Psych: Alert and cooperative, normal mood and affect.  Labs: Lab Results  Component Value Date   CREATININE 0.80 06/07/2019   BUN 4 (L) 06/07/2019   NA 141 06/07/2019   K 4.4 06/07/2019   CL 100 06/07/2019   CO2 26 06/07/2019   Lab Results  Component Value Date   ALT 6 06/07/2019   AST 19 06/07/2019   ALKPHOS 84 06/07/2019   BILITOT 0.3 06/07/2019   Lab Results  Component Value Date   WBC 7.8 06/07/2019   HGB 11.8 (L) 06/07/2019   HCT 35.3 (L) 06/07/2019   MCV 96 06/07/2019   PLT 382  06/07/2019     Imaging Studies: No results found.

## 2020-03-01 ENCOUNTER — Ambulatory Visit: Payer: Medicaid Other | Admitting: Family Medicine

## 2020-03-16 ENCOUNTER — Other Ambulatory Visit: Payer: Self-pay

## 2020-03-16 ENCOUNTER — Encounter: Payer: Self-pay | Admitting: Family Medicine

## 2020-03-16 ENCOUNTER — Ambulatory Visit: Payer: Medicaid Other | Admitting: Family Medicine

## 2020-03-16 VITALS — BP 114/65 | HR 61 | Temp 97.1°F | Ht 71.0 in | Wt 120.4 lb

## 2020-03-16 DIAGNOSIS — R1310 Dysphagia, unspecified: Secondary | ICD-10-CM

## 2020-03-16 DIAGNOSIS — F411 Generalized anxiety disorder: Secondary | ICD-10-CM

## 2020-03-16 MED ORDER — ESCITALOPRAM OXALATE 10 MG PO TABS: 10.0000 mg | ORAL_TABLET | Freq: Every day | ORAL | 2 refills | Status: DC

## 2020-03-16 NOTE — Progress Notes (Signed)
Assessment & Plan:  1. GAD (generalized anxiety disorder) - Patient initially said he didn't want another medication for anxiety, that the only thing he wanted was Xanax. Prior to walking out of the room he changed his mind. Rx'd Lexapro. Education provided on mindfulness-based stress reduction.  - escitalopram (LEXAPRO) 10 MG tablet; Take 1 tablet (10 mg total) by mouth daily.  Dispense: 30 tablet; Refill: 2  2. Dysphagia, unspecified type - Encouraged patient to reschedule appointment with GI as he does not have to have trouble swallowing despite how long it has been going on. Phone # given to patient on his AVS.    Return in about 6 weeks (around 04/27/2020) for anxiety.  Hendricks Limes, MSN, APRN, FNP-C [facility]  Subjective:    Patient ID: Blake Atkinson, male    DOB: 08/01/1960, 59 y.o.   MRN: [id number]  Patient Care Team: Loman Brooklyn, FNP as PCP - General (Family Medicine) Gala Romney Cristopher Estimable, MD as Consulting Physician (Gastroenterology)   Chief Complaint:  Chief Complaint  Patient presents with  . Anxiety    4 week follow up    HPI: Blake Atkinson is a 59 y.o. male presenting on 03/16/2020 for Anxiety (4 week follow up)  Patient is here for a follow-up of anxiety.  The last time he was seen he was advised that he would be weaned off of Xanax due to inappropriate use.  Patient reported he was taking it due to dysphagia.  He was also referred to gastroenterology at that time.  Patient is a month and a half late for his 4-week follow-up and has therefore been out of his Xanax for at least the past 3 weeks.  He was given buspirone at that appointment which he reports he only took for 4-5 days because it made him feel sick on his stomach.  He canceled his appointment with the gastroenterologist and states today it is because he has been like this for 10 years.  GAD 7 : Generalized Anxiety Score 01/08/2020 01/06/2020 12/08/2019 06/07/2019  Nervous, Anxious,  on Edge 0 0 0 0  Control/stop worrying 0 0 0 0  Worry too much - different things 0 0 0 0  Trouble relaxing 0 0 0 0  Restless 0 0 0 0  Easily annoyed or irritable 0 0 0 0  Afraid - awful might happen 0 0 0 0  Total GAD 7 Score 0 0 0 0  Anxiety Difficulty Not difficult at all - Not difficult at all Not difficult at all    New complaints: None  Social history:  Relevant past medical, surgical, family and social history reviewed and updated as indicated. Interim medical history since our last visit reviewed.  Allergies and medications reviewed and updated.  DATA REVIEWED: CHART IN EPIC  ROS: Negative unless specifically indicated above in HPI.    Current Outpatient Medications:  .  busPIRone (BUSPAR) 7.5 MG tablet, Take 1 tablet (7.5 mg total) by mouth 2 (two) times daily., Disp: 60 tablet, Rfl: 2 .  ibuprofen (ADVIL) 800 MG tablet, Take 800 mg by mouth 3 (three) times daily., Disp: , Rfl:  .  loratadine (ALLERGY) 10 MG tablet, Take 1 tablet (10 mg total) by mouth daily., Disp: 30 tablet, Rfl: 11 .  oxyCODONE-acetaminophen (PERCOCET) 10-325 MG tablet, Take 1 tablet by mouth 5 (five) times daily., Disp: , Rfl:  .  ALPRAZolam (XANAX) 1 MG tablet, Take 1 tablet (1 mg total) by mouth 2 (  two) times daily AND 0.5 tablets (0.5 mg total) at bedtime. (Patient not taking: Reported on 03/16/2020), Disp: 75 tablet, Rfl: 0   No Known Allergies Past Medical History:  Diagnosis Date  . Anxiety   . Blood transfusion without reported diagnosis   . Cancer ([place])   . 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 History of chemotherapy 2011   Dr. Jacquiline Doe  . History of radiation therapy 12/25/09 thru 02/11/2010   Dr. Sondra Come  . History of seizures    X2. alcohol related.    Past Surgical History:  Procedure Laterality Date  . COLONOSCOPY  2011   Dr. Earley Brooke: normal  . S/P extractions with alveoloplasty  11/29/09   S/P extraction of tooth  numbers 1, 4, 5, 6, 10, 11, 14, 15, 16, 17, 20, 29, 30, 31, and 32 with alveoloplasty, maxillary right tuberosity reduction, and gross debridement of remaining dentition with Dr. Enrique Sack  . soft palate biopsy  11/13/09   Dr. Adriana Reams. Pathology postive for SCCa.    Social History   Socioeconomic History  . Marital status: Divorced    Spouse name: Not on file  . Number of children: Not on file  . Years of education: Not on file  . Highest education level: Not on file  Occupational History  . Not on file  Tobacco Use  . Smoking status: Current Every Day Smoker    Packs/day: 1.00    Years: 28.00    Pack years: 28.00    Types: Cigarettes  . Smokeless tobacco: Never Used  Vaping Use  . Vaping Use: Never used  Substance and Sexual Activity  . Alcohol use: No    Comment: Quit in 2011.  . Drug use: No  . Sexual activity: Not Currently  Other Topics Concern  . Not on file  Social History Narrative   Patient is single.   Patient still smoking approximately one pack per day for the past 28 years.   Patient quit drinking alcohol in 2011.   Social Determinants of Health   Financial Resource Strain:   . Difficulty of Paying Living Expenses: Not on file  Food Insecurity:   . Worried About Charity fundraiser in the Last Year: Not on file  . Ran Out of Food in the Last Year: Not on file  Transportation Needs:   . Lack of Transportation (Medical): Not on file  . Lack of Transportation (Non-Medical): Not on file  Physical Activity:   . Days of Exercise per Week: Not on file  . Minutes of Exercise per Session: Not on file  Stress:   . Feeling of Stress : Not on file  Social Connections:   . Frequency of Communication with Friends and Family: Not on file  . Frequency of Social Gatherings with Friends and Family: Not on file  . Attends Religious Services: Not on file  . Active Member of Clubs or Organizations: Not on file  . Attends Archivist Meetings: Not on file  .  Marital Status: Not on file  Intimate Partner Violence:   . Fear of Current or Ex-Partner: Not on file  . Emotionally Abused: Not on file  . Physically Abused: Not on file  . Sexually Abused: Not on file        Objective:    BP 114/65   Pulse 61   Temp (!) 97.1 F (36.2 C) (Temporal)   Ht 5'  11" (1.803 m)   Wt 120 lb 6.4 oz (54.6 kg)   SpO2 100%   BMI 16.79 kg/m   Wt Readings from Last 3 Encounters:  03/16/20 120 lb 6.4 oz (54.6 kg)  01/06/20 124 lb 3.2 oz (56.3 kg)  12/08/19 124 lb (56.2 kg)    Physical Exam Vitals reviewed.  Constitutional:      General: He is not in acute distress.    Appearance: Normal appearance. He is underweight. He is not ill-appearing, toxic-appearing or diaphoretic.  HENT:     Head: Normocephalic and atraumatic.  Eyes:     General: No scleral icterus.       Right eye: No discharge.        Left eye: No discharge.     Conjunctiva/sclera: Conjunctivae normal.  Cardiovascular:     Rate and Rhythm: Normal rate and regular rhythm.     Heart sounds: Normal heart sounds. No murmur heard.  No friction rub. No gallop.   Pulmonary:     Effort: Pulmonary effort is normal. No respiratory distress.     Breath sounds: Normal breath sounds. No stridor. No wheezing, rhonchi or rales.  Musculoskeletal:        General: Normal range of motion.     Cervical back: Normal range of motion.  Skin:    General: Skin is warm and dry.  Neurological:     Mental Status: He is alert and oriented to person, place, and time. Mental status is at baseline.  Psychiatric:        Mood and Affect: Mood normal.        Behavior: Behavior normal.        Thought Content: Thought content normal.        Judgment: Judgment normal.     No results found for: TSH Lab Results  Component Value Date   WBC 7.8 06/07/2019   HGB 11.8 (L) 06/07/2019   HCT 35.3 (L) 06/07/2019   MCV 96 06/07/2019   PLT 382 06/07/2019   Lab Results  Component Value Date   NA 141 06/07/2019   K  4.4 06/07/2019   CO2 26 06/07/2019   GLUCOSE 106 (H) 06/07/2019   BUN 4 (L) 06/07/2019   CREATININE 0.80 06/07/2019   BILITOT 0.3 06/07/2019   ALKPHOS 84 06/07/2019   AST 19 06/07/2019   ALT 6 06/07/2019   PROT 6.8 06/07/2019   ALBUMIN 4.3 06/07/2019   CALCIUM 9.5 06/07/2019   Lab Results  Component Value Date   CHOL 164 06/07/2019   Lab Results  Component Value Date   HDL 38 (L) 06/07/2019   Lab Results  Component Value Date   LDLCALC 113 (H) 06/07/2019   Lab Results  Component Value Date   TRIG 69 06/07/2019   Lab Results  Component Value Date   CHOLHDL 4.3 06/07/2019   No results found for: HGBA1C

## 2020-03-16 NOTE — Patient Instructions (Signed)
[facility] [phone]   Mindfulness-Based Stress Reduction Mindfulness-based stress reduction (MBSR) is a program that helps people learn to practice mindfulness. Mindfulness is the practice of intentionally paying attention to the present moment. It can be learned and practiced through techniques such as education, breathing exercises, meditation, and yoga. MBSR includes several mindfulness techniques in one program. MBSR works best when you understand the treatment, are willing to try new things, and can commit to spending time practicing what you learn. MBSR training may include learning about:  How your emotions, thoughts, and reactions affect your body.  New ways to respond to things that cause negative thoughts to start (triggers).  How to notice your thoughts and let go of them.  Practicing awareness of everyday things that you normally do without thinking.  The techniques and goals of different types of meditation. What are the benefits of MBSR? MBSR can have many benefits, which include helping you to:  Develop self-awareness. This refers to knowing and understanding yourself.  Learn skills and attitudes that help you to participate in your own health care.  Learn new ways to care for yourself.  Be more accepting about how things are, and let things go.  Be less judgmental and approach things with an open mind.  Be patient with yourself and trust yourself more. MBSR has also been shown to:  Reduce negative emotions, such as depression and anxiety.  Improve memory and focus.  Change how you sense and approach pain.  Boost your body's ability to fight infections.  Help you connect better with other people.  Improve your sense of well-being. Follow these instructions at home:   Find a local in-person or online MBSR program.  Set aside some time regularly for mindfulness practice.  Find a mindfulness practice that works best for  you. This may include one or more of the following: ? Meditation. Meditation involves focusing your mind on a certain thought or activity. ? Breathing awareness exercises. These help you to stay present by focusing on your breath. ? Body scan. For this practice, you lie down and pay attention to each part of your body from head to toe. You can identify tension and soreness and intentionally relax parts of your body. ? Yoga. Yoga involves stretching and breathing, and it can improve your ability to move and be flexible. It can also provide an experience of testing your body's limits, which can help you release stress. ? Mindful eating. This way of eating involves focusing on the taste, texture, color, and smell of each bite of food. Because this slows down eating and helps you feel full sooner, it can be an important part of a weight-loss plan.  Find a podcast or recording that provides guidance for breathing awareness, body scan, or meditation exercises. You can listen to these any time when you have a free moment to rest without distractions.  Follow your treatment plan as told by your health care provider. This may include taking regular medicines and making changes to your diet or lifestyle as recommended. How to practice mindfulness To do a basic awareness exercise:  Find a comfortable place to sit.  Pay attention to the present moment. Observe your thoughts, feelings, and surroundings just as they are.  Avoid placing judgment on yourself, your feelings, or your surroundings. Make note of any judgment that comes up, and let it go.  Your mind may wander, and that is okay. Make note of when your thoughts drift, and return your attention  to the present moment. To do basic mindfulness meditation:  Find a comfortable place to sit. This may include a stable chair or a firm floor cushion. ? Sit upright with your back straight. Let your arms fall next to your side with your hands resting on your  legs. ? If sitting in a chair, rest your feet flat on the floor. ? If sitting on a cushion, cross your legs in front of you.  Keep your head in a neutral position with your chin dropped slightly. Relax your jaw and rest the tip of your tongue on the roof of your mouth. Drop your gaze to the floor. You can close your eyes if you like.  Breathe normally and pay attention to your breath. Feel the air moving in and out of your nose. Feel your belly expanding and relaxing with each breath.  Your mind may wander, and that is okay. Make note of when your thoughts drift, and return your attention to your breath.  Avoid placing judgment on yourself, your feelings, or your surroundings. Make note of any judgment or feelings that come up, let them go, and bring your attention back to your breath.  When you are ready, lift your gaze or open your eyes. Pay attention to how your body feels after the meditation. Where to find more information You can find more information about MBSR from:  Your health care provider.  Community-based meditation centers or programs.  Programs offered near you. Summary  Mindfulness-based stress reduction (MBSR) is a program that teaches you how to intentionally pay attention to the present moment. It is used with other treatments to help you cope better with daily stress, emotions, and pain.  MBSR focuses on developing self-awareness, which allows you to respond to life stress without judgment or negative emotions.  MBSR programs may involve learning different mindfulness practices, such as breathing exercises, meditation, yoga, body scan, or mindful eating. Find a mindfulness practice that works best for you, and set aside time for it on a regular basis. This information is not intended to replace advice given to you by your health care provider. Make sure you discuss any questions you have with your health care provider. Document Revised: 06/05/2017 Document Reviewed:  10/30/2016 Elsevier Patient Education  [place].

## 2020-03-19 ENCOUNTER — Encounter: Payer: Self-pay | Admitting: Family Medicine

## 2020-03-20 DIAGNOSIS — G893 Neoplasm related pain (acute) (chronic): Secondary | ICD-10-CM | POA: Diagnosis not present

## 2020-03-20 DIAGNOSIS — Z79899 Other long term (current) drug therapy: Secondary | ICD-10-CM | POA: Diagnosis not present

## 2020-03-20 DIAGNOSIS — R6884 Jaw pain: Secondary | ICD-10-CM | POA: Diagnosis not present

## 2020-03-20 DIAGNOSIS — G8929 Other chronic pain: Secondary | ICD-10-CM | POA: Diagnosis not present

## 2020-03-20 DIAGNOSIS — C760 Malignant neoplasm of head, face and neck: Secondary | ICD-10-CM | POA: Diagnosis not present

## 2020-04-19 DIAGNOSIS — Z79899 Other long term (current) drug therapy: Secondary | ICD-10-CM | POA: Diagnosis not present

## 2020-04-19 DIAGNOSIS — G8929 Other chronic pain: Secondary | ICD-10-CM | POA: Diagnosis not present

## 2020-04-19 DIAGNOSIS — R6884 Jaw pain: Secondary | ICD-10-CM | POA: Diagnosis not present

## 2020-04-19 DIAGNOSIS — E559 Vitamin D deficiency, unspecified: Secondary | ICD-10-CM | POA: Diagnosis not present

## 2020-04-19 DIAGNOSIS — C760 Malignant neoplasm of head, face and neck: Secondary | ICD-10-CM | POA: Diagnosis not present

## 2020-04-19 DIAGNOSIS — G893 Neoplasm related pain (acute) (chronic): Secondary | ICD-10-CM | POA: Diagnosis not present

## 2020-05-02 ENCOUNTER — Ambulatory Visit: Payer: Medicaid Other | Admitting: Family Medicine

## 2020-05-03 ENCOUNTER — Encounter: Payer: Self-pay | Admitting: Family Medicine

## 2020-05-03 ENCOUNTER — Ambulatory Visit: Payer: Medicaid Other | Admitting: Nurse Practitioner

## 2020-05-03 NOTE — Progress Notes (Deleted)
Established Patient Office Visit  Subjective:  Patient ID: Blake Atkinson, male    DOB: June 16, 1961  Age: 59 y.o. MRN: [id number]  CC: No chief complaint on file.   HPI Blake Atkinson presents for ***  Past Medical History:  Diagnosis Date  . Anxiety   . Blood transfusion without reported diagnosis   . Cancer ([place])   . 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 History of chemotherapy 2011   Dr. Jacquiline Doe  . History of radiation therapy 12/25/09 thru 02/11/2010   Dr. Sondra Come  . History of seizures    X2. alcohol related.    Past Surgical History:  Procedure Laterality Date  . COLONOSCOPY  2011   Dr. Earley Brooke: normal  . S/P extractions with alveoloplasty  11/29/09   S/P extraction of tooth numbers 1, 4, 5, 6, 10, 11, 14, 15, 16, 17, 20, 29, 30, 31, and 32 with alveoloplasty, maxillary right tuberosity reduction, and gross debridement of remaining dentition with Dr. Enrique Sack  . soft palate biopsy  11/13/09   Dr. Adriana Reams. Pathology postive for SCCa.    Family History  Problem Relation Age of Onset  . Hypertension Mother   . Stroke Mother   . Thyroid disease Mother   . Hyperlipidemia Mother   . Dementia Father   . Hypertension Other   . Heart disease Brother     Social History   Socioeconomic History  . Marital status: Divorced    Spouse name: Not on file  . Number of children: Not on file  . Years of education: Not on file  . Highest education level: Not on file  Occupational History  . Not on file  Tobacco Use  . Smoking status: Current Every Day Smoker    Packs/day: 1.00    Years: 28.00    Pack years: 28.00    Types: Cigarettes  . Smokeless tobacco: Never Used  Vaping Use  . Vaping Use: Never used  Substance and Sexual Activity  . Alcohol use: No    Comment: Quit in 2011.  . Drug use: No  . Sexual activity: Not Currently  Other Topics Concern  . Not on file  Social History Narrative   Patient  is single.   Patient still smoking approximately one pack per day for the past 28 years.   Patient quit drinking alcohol in 2011.   Social Determinants of Health   Financial Resource Strain:   . Difficulty of Paying Living Expenses: Not on file  Food Insecurity:   . Worried About Charity fundraiser in the Last Year: Not on file  . Ran Out of Food in the Last Year: Not on file  Transportation Needs:   . Lack of Transportation (Medical): Not on file  . Lack of Transportation (Non-Medical): Not on file  Physical Activity:   . Days of Exercise per Week: Not on file  . Minutes of Exercise per Session: Not on file  Stress:   . Feeling of Stress : Not on file  Social Connections:   . Frequency of Communication with Friends and Family: Not on file  . Frequency of Social Gatherings with Friends and Family: Not on file  . Attends Religious Services: Not on file  . Active Member of Clubs or Organizations: Not on file  . Attends Archivist Meetings: Not on file  . Marital Status: Not on file  Intimate Partner Violence:   .  Fear of Current or Ex-Partner: Not on file  . Emotionally Abused: Not on file  . Physically Abused: Not on file  . Sexually Abused: Not on file    Outpatient Medications Prior to Visit  Medication Sig Dispense Refill  . escitalopram (LEXAPRO) 10 MG tablet Take 1 tablet (10 mg total) by mouth daily. 30 tablet 2  . ibuprofen (ADVIL) 800 MG tablet Take 800 mg by mouth 3 (three) times daily.    Marland Kitchen loratadine (ALLERGY) 10 MG tablet Take 1 tablet (10 mg total) by mouth daily. 30 tablet 11  . oxyCODONE-acetaminophen (PERCOCET) 10-325 MG tablet Take 1 tablet by mouth 5 (five) times daily.     No facility-administered medications prior to visit.    Allergies  Allergen Reactions  . Buspar [Buspirone] Other (See Comments)    "sick on stomach"    ROS Review of Systems    Objective:    Physical Exam  There were no vitals taken for this visit. Wt Readings  from Last 3 Encounters:  03/16/20 120 lb 6.4 oz (54.6 kg)  01/06/20 124 lb 3.2 oz (56.3 kg)  12/08/19 124 lb (56.2 kg)     Health Maintenance Due  Topic Date Due  . COVID-19 Vaccine (1) Never done  . TETANUS/TDAP  Never done  . COLONOSCOPY  04/26/2020    There are no preventive care reminders to display for this patient.  No results found for: TSH Lab Results  Component Value Date   WBC 7.8 06/07/2019   HGB 11.8 (L) 06/07/2019   HCT 35.3 (L) 06/07/2019   MCV 96 06/07/2019   PLT 382 06/07/2019   Lab Results  Component Value Date   NA 141 06/07/2019   K 4.4 06/07/2019   CO2 26 06/07/2019   GLUCOSE 106 (H) 06/07/2019   BUN 4 (L) 06/07/2019   CREATININE 0.80 06/07/2019   BILITOT 0.3 06/07/2019   ALKPHOS 84 06/07/2019   AST 19 06/07/2019   ALT 6 06/07/2019   PROT 6.8 06/07/2019   ALBUMIN 4.3 06/07/2019   CALCIUM 9.5 06/07/2019   Lab Results  Component Value Date   CHOL 164 06/07/2019   Lab Results  Component Value Date   HDL 38 (L) 06/07/2019   Lab Results  Component Value Date   LDLCALC 113 (H) 06/07/2019   Lab Results  Component Value Date   TRIG 69 06/07/2019   Lab Results  Component Value Date   CHOLHDL 4.3 06/07/2019   No results found for: HGBA1C    Assessment & Plan:   Problem List Items Addressed This Visit    None      No orders of the defined types were placed in this encounter.   Follow-up: No follow-ups on file.    Ivy Lynn, NP

## 2020-05-16 DIAGNOSIS — Z79899 Other long term (current) drug therapy: Secondary | ICD-10-CM | POA: Diagnosis not present

## 2020-05-16 DIAGNOSIS — C760 Malignant neoplasm of head, face and neck: Secondary | ICD-10-CM | POA: Diagnosis not present

## 2020-05-16 DIAGNOSIS — R6884 Jaw pain: Secondary | ICD-10-CM | POA: Diagnosis not present

## 2020-05-16 DIAGNOSIS — G8929 Other chronic pain: Secondary | ICD-10-CM | POA: Diagnosis not present

## 2020-05-16 DIAGNOSIS — G893 Neoplasm related pain (acute) (chronic): Secondary | ICD-10-CM | POA: Diagnosis not present

## 2020-06-14 DIAGNOSIS — C760 Malignant neoplasm of head, face and neck: Secondary | ICD-10-CM | POA: Diagnosis not present

## 2020-06-14 DIAGNOSIS — G893 Neoplasm related pain (acute) (chronic): Secondary | ICD-10-CM | POA: Diagnosis not present

## 2020-06-14 DIAGNOSIS — R6884 Jaw pain: Secondary | ICD-10-CM | POA: Diagnosis not present

## 2020-06-14 DIAGNOSIS — G8929 Other chronic pain: Secondary | ICD-10-CM | POA: Diagnosis not present

## 2020-06-14 DIAGNOSIS — Z79899 Other long term (current) drug therapy: Secondary | ICD-10-CM | POA: Diagnosis not present

## 2020-06-15 ENCOUNTER — Other Ambulatory Visit: Payer: Self-pay | Admitting: Family Medicine

## 2020-06-15 DIAGNOSIS — F411 Generalized anxiety disorder: Secondary | ICD-10-CM

## 2020-07-12 DIAGNOSIS — G8929 Other chronic pain: Secondary | ICD-10-CM | POA: Diagnosis not present

## 2020-07-12 DIAGNOSIS — G893 Neoplasm related pain (acute) (chronic): Secondary | ICD-10-CM | POA: Diagnosis not present

## 2020-07-12 DIAGNOSIS — C760 Malignant neoplasm of head, face and neck: Secondary | ICD-10-CM | POA: Diagnosis not present

## 2020-07-12 DIAGNOSIS — R6884 Jaw pain: Secondary | ICD-10-CM | POA: Diagnosis not present

## 2020-07-12 DIAGNOSIS — Z79899 Other long term (current) drug therapy: Secondary | ICD-10-CM | POA: Diagnosis not present

## 2020-08-10 DIAGNOSIS — C760 Malignant neoplasm of head, face and neck: Secondary | ICD-10-CM | POA: Diagnosis not present

## 2020-08-10 DIAGNOSIS — R6884 Jaw pain: Secondary | ICD-10-CM | POA: Diagnosis not present

## 2020-08-10 DIAGNOSIS — G893 Neoplasm related pain (acute) (chronic): Secondary | ICD-10-CM | POA: Diagnosis not present

## 2020-08-10 DIAGNOSIS — G8929 Other chronic pain: Secondary | ICD-10-CM | POA: Diagnosis not present

## 2020-08-10 DIAGNOSIS — Z79899 Other long term (current) drug therapy: Secondary | ICD-10-CM | POA: Diagnosis not present

## 2020-10-03 ENCOUNTER — Ambulatory Visit: Payer: Medicaid Other | Admitting: Family Medicine

## 2020-10-10 DIAGNOSIS — R6884 Jaw pain: Secondary | ICD-10-CM | POA: Diagnosis not present

## 2020-10-10 DIAGNOSIS — C760 Malignant neoplasm of head, face and neck: Secondary | ICD-10-CM | POA: Diagnosis not present

## 2020-10-10 DIAGNOSIS — G893 Neoplasm related pain (acute) (chronic): Secondary | ICD-10-CM | POA: Diagnosis not present

## 2020-10-10 DIAGNOSIS — Z79899 Other long term (current) drug therapy: Secondary | ICD-10-CM | POA: Diagnosis not present

## 2020-10-10 DIAGNOSIS — G8929 Other chronic pain: Secondary | ICD-10-CM | POA: Diagnosis not present

## 2020-10-23 ENCOUNTER — Other Ambulatory Visit: Payer: Self-pay

## 2020-10-23 ENCOUNTER — Ambulatory Visit: Payer: Medicaid Other | Admitting: Family Medicine

## 2020-10-23 ENCOUNTER — Encounter: Payer: Self-pay | Admitting: Family Medicine

## 2020-10-23 VITALS — BP 117/66 | HR 63 | Temp 97.7°F | Ht 71.0 in | Wt 131.6 lb

## 2020-10-23 DIAGNOSIS — F411 Generalized anxiety disorder: Secondary | ICD-10-CM | POA: Diagnosis not present

## 2020-10-23 DIAGNOSIS — R1310 Dysphagia, unspecified: Secondary | ICD-10-CM | POA: Diagnosis not present

## 2020-10-23 MED ORDER — FLUOXETINE HCL 20 MG PO CAPS: 20.0000 mg | ORAL_CAPSULE | Freq: Every day | ORAL | 2 refills | Status: AC

## 2020-10-23 NOTE — Patient Instructions (Signed)

## 2020-10-23 NOTE — Progress Notes (Signed)
Assessment & Plan:  1. GAD (generalized anxiety disorder) Uncontrolled.  Rx'd Prozac 20 mg once daily.  Encourage patient to take until his follow-up unless he is having side effects, then he needs to let me know before then.  Discussed that it takes at least 6 weeks for him to get max benefit out of the medication.  Education provided on managing anxiety in adults. - FLUoxetine (PROZAC) 20 MG capsule; Take 1 capsule (20 mg total) by mouth daily.  Dispense: 30 capsule; Refill: 2  2. Dysphagia, unspecified type Patient does not plan to follow-up with GI.   Return in about 6 weeks (around 12/04/2020) for annual physical & anxiety.  Hendricks Limes, MSN, APRN, FNP-C [facility]  Subjective:    Patient ID: Blake Atkinson, male    DOB: 1961-05-08, 60 y.o.   MRN: [id number]  Patient Care Team: Loman Brooklyn, FNP as PCP - General (Family Medicine) Gala Romney Cristopher Estimable, MD as Consulting Physician (Gastroenterology)   Chief Complaint:  Chief Complaint  Patient presents with  . Anxiety    Check up of chronic medical conditions.  Patient states that his anxiety is "not too good".     HPI: Blake Atkinson is a 60 y.o. male presenting on 10/23/2020 for Anxiety (Check up of chronic medical conditions.  Patient states that his anxiety is "not too good". )  Patient feels his anxiety is not doing too well.  He was prescribed Lexapro 10 mg once daily at her last visit in September; this is his first follow-up since then.  He reports he only took the medication for about 2 weeks because he did not feel it was helping.  He previously failed treatment with buspirone.  He was on Xanax in the past and was weaned off.  Depression screen [facility] 2/9 10/23/2020 03/16/2020 01/08/2020  Decreased Interest 1 2 0  Down, Depressed, Hopeless 2 0 0  PHQ - 2 Score 3 2 0  Altered sleeping 0 0 0  Tired, decreased energy 2 1 0  Change in appetite 1 0 0  Feeling bad or failure about yourself  2 0 0   Trouble concentrating 2 1 0  Moving slowly or fidgety/restless 2 1 0  Suicidal thoughts 0 0 0  PHQ-9 Score 12 5 0  Difficult doing work/chores Not difficult at all - Not difficult at all   GAD 7 : Generalized Anxiety Score 10/23/2020 03/16/2020 01/08/2020 01/06/2020  Nervous, Anxious, on Edge 2 1 0 0  Control/stop worrying 1 1 0 0  Worry too much - different things 2 1 0 0  Trouble relaxing 1 1 0 0  Restless 1 1 0 0  Easily annoyed or irritable 2 1 0 0  Afraid - awful might happen 1 0 0 0  Total GAD 7 Score 10 6 0 0  Anxiety Difficulty Not difficult at all - Not difficult at all -    Patient has not scheduled an appointment with GI for his dysphagia and states he does not plan to do so.  New complaints: None  Social history:  Relevant past medical, surgical, family and social history reviewed and updated as indicated. Interim medical history since our last visit reviewed.  Allergies and medications reviewed and updated.  DATA REVIEWED: CHART IN EPIC  ROS: Negative unless specifically indicated above in HPI.    Current Outpatient Medications:  .  ibuprofen (ADVIL) 800 MG tablet, Take 800 mg by mouth 3 (three) times daily., Disp: ,  Rfl:  .  oxyCODONE-acetaminophen (PERCOCET) 10-325 MG tablet, Take 1 tablet by mouth 5 (five) times daily., Disp: , Rfl:    Allergies  Allergen Reactions  . Buspar [Buspirone] Other (See Comments)    "sick on stomach"   Past Medical History:  Diagnosis Date  . Anxiety   . Blood transfusion without reported diagnosis   . Cancer ([place])   . 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 History of chemotherapy 2011   Dr. Jacquiline Doe  . History of radiation therapy 12/25/09 thru 02/11/2010   Dr. Sondra Come  . History of seizures    X2. alcohol related.    Past Surgical History:  Procedure Laterality Date  . COLONOSCOPY  2011   Dr. Earley Brooke: normal  . S/P extractions with alveoloplasty  11/29/09   S/P  extraction of tooth numbers 1, 4, 5, 6, 10, 11, 14, 15, 16, 17, 20, 29, 30, 31, and 32 with alveoloplasty, maxillary right tuberosity reduction, and gross debridement of remaining dentition with Dr. Enrique Sack  . soft palate biopsy  11/13/09   Dr. Adriana Reams. Pathology postive for SCCa.    Social History   Socioeconomic History  . Marital status: Divorced    Spouse name: Not on file  . Number of children: Not on file  . Years of education: Not on file  . Highest education level: Not on file  Occupational History  . Not on file  Tobacco Use  . Smoking status: Current Every Day Smoker    Packs/day: 1.00    Years: 28.00    Pack years: 28.00    Types: Cigarettes  . Smokeless tobacco: Never Used  Vaping Use  . Vaping Use: Never used  Substance and Sexual Activity  . Alcohol use: No    Comment: Quit in 2011.  . Drug use: No  . Sexual activity: Not Currently  Other Topics Concern  . Not on file  Social History Narrative   Patient is single.   Patient still smoking approximately one pack per day for the past 28 years.   Patient quit drinking alcohol in 2011.   Social Determinants of Health   Financial Resource Strain: Not on file  Food Insecurity: Not on file  Transportation Needs: Not on file  Physical Activity: Not on file  Stress: Not on file  Social Connections: Not on file  Intimate Partner Violence: Not on file        Objective:    BP 117/66   Pulse 63   Temp 97.7 F (36.5 C) (Temporal)   Ht 5\' 11"  (1.803 m)   Wt 131 lb 9.6 oz (59.7 kg)   BMI 18.35 kg/m   Wt Readings from Last 3 Encounters:  10/23/20 131 lb 9.6 oz (59.7 kg)  03/16/20 120 lb 6.4 oz (54.6 kg)  01/06/20 124 lb 3.2 oz (56.3 kg)    Physical Exam Vitals reviewed.  Constitutional:      General: He is not in acute distress.    Appearance: Normal appearance. He is underweight. He is not ill-appearing, toxic-appearing or diaphoretic.  HENT:     Head: Normocephalic and atraumatic.  Eyes:      General: No scleral icterus.       Right eye: No discharge.        Left eye: No discharge.     Conjunctiva/sclera: Conjunctivae normal.  Cardiovascular:     Rate and Rhythm: Normal rate and regular rhythm.  Heart sounds: Normal heart sounds. No murmur heard. No friction rub. No gallop.   Pulmonary:     Effort: Pulmonary effort is normal. No respiratory distress.     Breath sounds: Normal breath sounds. No stridor. No wheezing, rhonchi or rales.  Musculoskeletal:        General: Normal range of motion.     Cervical back: Normal range of motion.  Skin:    General: Skin is warm and dry.  Neurological:     Mental Status: He is alert and oriented to person, place, and time. Mental status is at baseline.  Psychiatric:        Mood and Affect: Mood normal.        Behavior: Behavior normal.        Thought Content: Thought content normal.        Judgment: Judgment normal.     No results found for: TSH Lab Results  Component Value Date   WBC 7.8 06/07/2019   HGB 11.8 (L) 06/07/2019   HCT 35.3 (L) 06/07/2019   MCV 96 06/07/2019   PLT 382 06/07/2019   Lab Results  Component Value Date   NA 141 06/07/2019   K 4.4 06/07/2019   CO2 26 06/07/2019   GLUCOSE 106 (H) 06/07/2019   BUN 4 (L) 06/07/2019   CREATININE 0.80 06/07/2019   BILITOT 0.3 06/07/2019   ALKPHOS 84 06/07/2019   AST 19 06/07/2019   ALT 6 06/07/2019   PROT 6.8 06/07/2019   ALBUMIN 4.3 06/07/2019   CALCIUM 9.5 06/07/2019   Lab Results  Component Value Date   CHOL 164 06/07/2019   Lab Results  Component Value Date   HDL 38 (L) 06/07/2019   Lab Results  Component Value Date   LDLCALC 113 (H) 06/07/2019   Lab Results  Component Value Date   TRIG 69 06/07/2019   Lab Results  Component Value Date   CHOLHDL 4.3 06/07/2019   No results found for: HGBA1C

## 2020-11-23 DIAGNOSIS — Z79899 Other long term (current) drug therapy: Secondary | ICD-10-CM | POA: Diagnosis not present

## 2020-11-23 DIAGNOSIS — G8929 Other chronic pain: Secondary | ICD-10-CM | POA: Diagnosis not present

## 2020-11-23 DIAGNOSIS — R6884 Jaw pain: Secondary | ICD-10-CM | POA: Diagnosis not present

## 2020-11-23 DIAGNOSIS — C760 Malignant neoplasm of head, face and neck: Secondary | ICD-10-CM | POA: Diagnosis not present

## 2020-11-23 DIAGNOSIS — G893 Neoplasm related pain (acute) (chronic): Secondary | ICD-10-CM | POA: Diagnosis not present

## 2020-12-04 ENCOUNTER — Encounter: Payer: Medicaid Other | Admitting: Family Medicine

## 2020-12-05 ENCOUNTER — Encounter: Payer: Self-pay | Admitting: Family Medicine

## 2020-12-20 DIAGNOSIS — Z79899 Other long term (current) drug therapy: Secondary | ICD-10-CM | POA: Diagnosis not present

## 2020-12-20 DIAGNOSIS — C760 Malignant neoplasm of head, face and neck: Secondary | ICD-10-CM | POA: Diagnosis not present

## 2020-12-20 DIAGNOSIS — G893 Neoplasm related pain (acute) (chronic): Secondary | ICD-10-CM | POA: Diagnosis not present

## 2020-12-20 DIAGNOSIS — G8929 Other chronic pain: Secondary | ICD-10-CM | POA: Diagnosis not present

## 2020-12-20 DIAGNOSIS — R6884 Jaw pain: Secondary | ICD-10-CM | POA: Diagnosis not present

## 2021-01-15 DIAGNOSIS — C760 Malignant neoplasm of head, face and neck: Secondary | ICD-10-CM | POA: Diagnosis not present

## 2021-01-15 DIAGNOSIS — R6884 Jaw pain: Secondary | ICD-10-CM | POA: Diagnosis not present

## 2021-01-15 DIAGNOSIS — G893 Neoplasm related pain (acute) (chronic): Secondary | ICD-10-CM | POA: Diagnosis not present

## 2021-01-15 DIAGNOSIS — Z79899 Other long term (current) drug therapy: Secondary | ICD-10-CM | POA: Diagnosis not present

## 2021-01-15 DIAGNOSIS — G8929 Other chronic pain: Secondary | ICD-10-CM | POA: Diagnosis not present

## 2021-01-17 DIAGNOSIS — Z79899 Other long term (current) drug therapy: Secondary | ICD-10-CM | POA: Diagnosis not present

## 2021-02-15 DIAGNOSIS — R6884 Jaw pain: Secondary | ICD-10-CM | POA: Diagnosis not present

## 2021-02-15 DIAGNOSIS — G893 Neoplasm related pain (acute) (chronic): Secondary | ICD-10-CM | POA: Diagnosis not present

## 2021-02-15 DIAGNOSIS — G8929 Other chronic pain: Secondary | ICD-10-CM | POA: Diagnosis not present

## 2021-02-15 DIAGNOSIS — C760 Malignant neoplasm of head, face and neck: Secondary | ICD-10-CM | POA: Diagnosis not present

## 2021-02-15 DIAGNOSIS — Z79899 Other long term (current) drug therapy: Secondary | ICD-10-CM | POA: Diagnosis not present

## 2021-02-19 DIAGNOSIS — Z79899 Other long term (current) drug therapy: Secondary | ICD-10-CM | POA: Diagnosis not present

## 2021-03-19 DIAGNOSIS — G893 Neoplasm related pain (acute) (chronic): Secondary | ICD-10-CM | POA: Diagnosis not present

## 2021-03-19 DIAGNOSIS — G8929 Other chronic pain: Secondary | ICD-10-CM | POA: Diagnosis not present

## 2021-03-19 DIAGNOSIS — R6884 Jaw pain: Secondary | ICD-10-CM | POA: Diagnosis not present

## 2021-03-19 DIAGNOSIS — C760 Malignant neoplasm of head, face and neck: Secondary | ICD-10-CM | POA: Diagnosis not present

## 2021-03-19 DIAGNOSIS — Z79899 Other long term (current) drug therapy: Secondary | ICD-10-CM | POA: Diagnosis not present

## 2021-04-18 DIAGNOSIS — Z79899 Other long term (current) drug therapy: Secondary | ICD-10-CM | POA: Diagnosis not present

## 2021-04-18 DIAGNOSIS — G8929 Other chronic pain: Secondary | ICD-10-CM | POA: Diagnosis not present

## 2021-04-18 DIAGNOSIS — G893 Neoplasm related pain (acute) (chronic): Secondary | ICD-10-CM | POA: Diagnosis not present

## 2021-04-18 DIAGNOSIS — R6884 Jaw pain: Secondary | ICD-10-CM | POA: Diagnosis not present

## 2021-04-18 DIAGNOSIS — C760 Malignant neoplasm of head, face and neck: Secondary | ICD-10-CM | POA: Diagnosis not present

## 2021-04-22 DIAGNOSIS — Z79899 Other long term (current) drug therapy: Secondary | ICD-10-CM | POA: Diagnosis not present

## 2021-05-20 DIAGNOSIS — R6884 Jaw pain: Secondary | ICD-10-CM | POA: Diagnosis not present

## 2021-05-20 DIAGNOSIS — Z79899 Other long term (current) drug therapy: Secondary | ICD-10-CM | POA: Diagnosis not present

## 2021-05-20 DIAGNOSIS — G8929 Other chronic pain: Secondary | ICD-10-CM | POA: Diagnosis not present

## 2021-05-20 DIAGNOSIS — C760 Malignant neoplasm of head, face and neck: Secondary | ICD-10-CM | POA: Diagnosis not present

## 2021-05-20 DIAGNOSIS — G893 Neoplasm related pain (acute) (chronic): Secondary | ICD-10-CM | POA: Diagnosis not present

## 2021-06-18 DIAGNOSIS — G8929 Other chronic pain: Secondary | ICD-10-CM | POA: Diagnosis not present

## 2021-06-18 DIAGNOSIS — G893 Neoplasm related pain (acute) (chronic): Secondary | ICD-10-CM | POA: Diagnosis not present

## 2021-06-18 DIAGNOSIS — Z79899 Other long term (current) drug therapy: Secondary | ICD-10-CM | POA: Diagnosis not present

## 2021-06-18 DIAGNOSIS — R6884 Jaw pain: Secondary | ICD-10-CM | POA: Diagnosis not present

## 2021-06-18 DIAGNOSIS — C760 Malignant neoplasm of head, face and neck: Secondary | ICD-10-CM | POA: Diagnosis not present

## 2021-06-21 DIAGNOSIS — Z79899 Other long term (current) drug therapy: Secondary | ICD-10-CM | POA: Diagnosis not present

## 2021-07-18 DIAGNOSIS — Z79899 Other long term (current) drug therapy: Secondary | ICD-10-CM | POA: Diagnosis not present

## 2021-07-18 DIAGNOSIS — R6884 Jaw pain: Secondary | ICD-10-CM | POA: Diagnosis not present

## 2021-07-18 DIAGNOSIS — G893 Neoplasm related pain (acute) (chronic): Secondary | ICD-10-CM | POA: Diagnosis not present

## 2021-07-18 DIAGNOSIS — G8929 Other chronic pain: Secondary | ICD-10-CM | POA: Diagnosis not present

## 2021-07-18 DIAGNOSIS — C760 Malignant neoplasm of head, face and neck: Secondary | ICD-10-CM | POA: Diagnosis not present

## 2021-08-16 DIAGNOSIS — Z0000 Encounter for general adult medical examination without abnormal findings: Secondary | ICD-10-CM | POA: Diagnosis not present

## 2021-08-16 DIAGNOSIS — R5383 Other fatigue: Secondary | ICD-10-CM | POA: Diagnosis not present

## 2021-08-16 DIAGNOSIS — Z125 Encounter for screening for malignant neoplasm of prostate: Secondary | ICD-10-CM | POA: Diagnosis not present

## 2021-08-16 DIAGNOSIS — G893 Neoplasm related pain (acute) (chronic): Secondary | ICD-10-CM | POA: Diagnosis not present

## 2021-08-16 DIAGNOSIS — E559 Vitamin D deficiency, unspecified: Secondary | ICD-10-CM | POA: Diagnosis not present

## 2021-08-16 DIAGNOSIS — Z1331 Encounter for screening for depression: Secondary | ICD-10-CM | POA: Diagnosis not present

## 2021-08-16 DIAGNOSIS — Z79899 Other long term (current) drug therapy: Secondary | ICD-10-CM | POA: Diagnosis not present

## 2021-08-16 DIAGNOSIS — R6884 Jaw pain: Secondary | ICD-10-CM | POA: Diagnosis not present

## 2021-08-16 DIAGNOSIS — C760 Malignant neoplasm of head, face and neck: Secondary | ICD-10-CM | POA: Diagnosis not present

## 2021-08-16 DIAGNOSIS — G8929 Other chronic pain: Secondary | ICD-10-CM | POA: Diagnosis not present

## 2021-08-20 DIAGNOSIS — Z79899 Other long term (current) drug therapy: Secondary | ICD-10-CM | POA: Diagnosis not present

## 2021-09-13 DIAGNOSIS — R7303 Prediabetes: Secondary | ICD-10-CM | POA: Diagnosis not present

## 2021-09-13 DIAGNOSIS — G893 Neoplasm related pain (acute) (chronic): Secondary | ICD-10-CM | POA: Diagnosis not present

## 2021-09-13 DIAGNOSIS — G8929 Other chronic pain: Secondary | ICD-10-CM | POA: Diagnosis not present

## 2021-09-13 DIAGNOSIS — Z79899 Other long term (current) drug therapy: Secondary | ICD-10-CM | POA: Diagnosis not present

## 2021-09-13 DIAGNOSIS — R6884 Jaw pain: Secondary | ICD-10-CM | POA: Diagnosis not present

## 2021-10-14 DIAGNOSIS — R6884 Jaw pain: Secondary | ICD-10-CM | POA: Diagnosis not present

## 2021-10-14 DIAGNOSIS — G8929 Other chronic pain: Secondary | ICD-10-CM | POA: Diagnosis not present

## 2021-10-14 DIAGNOSIS — G893 Neoplasm related pain (acute) (chronic): Secondary | ICD-10-CM | POA: Diagnosis not present

## 2021-10-14 DIAGNOSIS — Z79899 Other long term (current) drug therapy: Secondary | ICD-10-CM | POA: Diagnosis not present

## 2021-10-14 DIAGNOSIS — R7303 Prediabetes: Secondary | ICD-10-CM | POA: Diagnosis not present

## 2021-10-16 DIAGNOSIS — Z79899 Other long term (current) drug therapy: Secondary | ICD-10-CM | POA: Diagnosis not present

## 2021-11-13 DIAGNOSIS — Z79899 Other long term (current) drug therapy: Secondary | ICD-10-CM | POA: Diagnosis not present

## 2021-11-13 DIAGNOSIS — R7303 Prediabetes: Secondary | ICD-10-CM | POA: Diagnosis not present

## 2021-11-13 DIAGNOSIS — R6884 Jaw pain: Secondary | ICD-10-CM | POA: Diagnosis not present

## 2021-11-13 DIAGNOSIS — G893 Neoplasm related pain (acute) (chronic): Secondary | ICD-10-CM | POA: Diagnosis not present

## 2021-11-13 DIAGNOSIS — G8929 Other chronic pain: Secondary | ICD-10-CM | POA: Diagnosis not present

## 2021-12-10 DIAGNOSIS — R7303 Prediabetes: Secondary | ICD-10-CM | POA: Diagnosis not present

## 2021-12-10 DIAGNOSIS — Z79899 Other long term (current) drug therapy: Secondary | ICD-10-CM | POA: Diagnosis not present

## 2021-12-10 DIAGNOSIS — R6884 Jaw pain: Secondary | ICD-10-CM | POA: Diagnosis not present

## 2021-12-10 DIAGNOSIS — G8929 Other chronic pain: Secondary | ICD-10-CM | POA: Diagnosis not present

## 2021-12-10 DIAGNOSIS — G893 Neoplasm related pain (acute) (chronic): Secondary | ICD-10-CM | POA: Diagnosis not present

## 2021-12-18 DIAGNOSIS — Z79899 Other long term (current) drug therapy: Secondary | ICD-10-CM | POA: Diagnosis not present

## 2022-01-09 DIAGNOSIS — C760 Malignant neoplasm of head, face and neck: Secondary | ICD-10-CM | POA: Diagnosis not present

## 2022-01-09 DIAGNOSIS — Z681 Body mass index (BMI) 19 or less, adult: Secondary | ICD-10-CM | POA: Diagnosis not present

## 2022-01-09 DIAGNOSIS — Z789 Other specified health status: Secondary | ICD-10-CM | POA: Diagnosis not present

## 2022-01-09 DIAGNOSIS — G893 Neoplasm related pain (acute) (chronic): Secondary | ICD-10-CM | POA: Diagnosis not present

## 2022-01-09 DIAGNOSIS — G8929 Other chronic pain: Secondary | ICD-10-CM | POA: Diagnosis not present

## 2022-01-09 DIAGNOSIS — Z79899 Other long term (current) drug therapy: Secondary | ICD-10-CM | POA: Diagnosis not present

## 2022-01-09 DIAGNOSIS — R6884 Jaw pain: Secondary | ICD-10-CM | POA: Diagnosis not present

## 2022-01-09 DIAGNOSIS — R7303 Prediabetes: Secondary | ICD-10-CM | POA: Diagnosis not present

## 2022-01-09 DIAGNOSIS — D508 Other iron deficiency anemias: Secondary | ICD-10-CM | POA: Diagnosis not present

## 2022-02-06 DIAGNOSIS — Z789 Other specified health status: Secondary | ICD-10-CM | POA: Diagnosis not present

## 2022-02-06 DIAGNOSIS — C760 Malignant neoplasm of head, face and neck: Secondary | ICD-10-CM | POA: Diagnosis not present

## 2022-02-06 DIAGNOSIS — G893 Neoplasm related pain (acute) (chronic): Secondary | ICD-10-CM | POA: Diagnosis not present

## 2022-02-06 DIAGNOSIS — Z79899 Other long term (current) drug therapy: Secondary | ICD-10-CM | POA: Diagnosis not present

## 2022-02-06 DIAGNOSIS — R6884 Jaw pain: Secondary | ICD-10-CM | POA: Diagnosis not present

## 2022-02-06 DIAGNOSIS — R7303 Prediabetes: Secondary | ICD-10-CM | POA: Diagnosis not present

## 2022-02-06 DIAGNOSIS — G8929 Other chronic pain: Secondary | ICD-10-CM | POA: Diagnosis not present

## 2022-02-06 DIAGNOSIS — Z681 Body mass index (BMI) 19 or less, adult: Secondary | ICD-10-CM | POA: Diagnosis not present

## 2022-02-06 DIAGNOSIS — D508 Other iron deficiency anemias: Secondary | ICD-10-CM | POA: Diagnosis not present

## 2022-02-10 DIAGNOSIS — Z79899 Other long term (current) drug therapy: Secondary | ICD-10-CM | POA: Diagnosis not present

## 2022-03-06 DIAGNOSIS — Z79899 Other long term (current) drug therapy: Secondary | ICD-10-CM | POA: Diagnosis not present

## 2022-03-06 DIAGNOSIS — Z789 Other specified health status: Secondary | ICD-10-CM | POA: Diagnosis not present

## 2022-03-06 DIAGNOSIS — D508 Other iron deficiency anemias: Secondary | ICD-10-CM | POA: Diagnosis not present

## 2022-03-06 DIAGNOSIS — G893 Neoplasm related pain (acute) (chronic): Secondary | ICD-10-CM | POA: Diagnosis not present

## 2022-03-06 DIAGNOSIS — R6884 Jaw pain: Secondary | ICD-10-CM | POA: Diagnosis not present

## 2022-03-06 DIAGNOSIS — E7800 Pure hypercholesterolemia, unspecified: Secondary | ICD-10-CM | POA: Diagnosis not present

## 2022-03-06 DIAGNOSIS — C760 Malignant neoplasm of head, face and neck: Secondary | ICD-10-CM | POA: Diagnosis not present

## 2022-03-06 DIAGNOSIS — R7303 Prediabetes: Secondary | ICD-10-CM | POA: Diagnosis not present

## 2022-03-06 DIAGNOSIS — Z681 Body mass index (BMI) 19 or less, adult: Secondary | ICD-10-CM | POA: Diagnosis not present

## 2022-03-06 DIAGNOSIS — G8929 Other chronic pain: Secondary | ICD-10-CM | POA: Diagnosis not present

## 2022-03-12 DIAGNOSIS — Z79899 Other long term (current) drug therapy: Secondary | ICD-10-CM | POA: Diagnosis not present

## 2022-04-14 DIAGNOSIS — E785 Hyperlipidemia, unspecified: Secondary | ICD-10-CM | POA: Diagnosis not present

## 2022-04-14 DIAGNOSIS — Z681 Body mass index (BMI) 19 or less, adult: Secondary | ICD-10-CM | POA: Diagnosis not present

## 2022-04-14 DIAGNOSIS — R6884 Jaw pain: Secondary | ICD-10-CM | POA: Diagnosis not present

## 2022-04-14 DIAGNOSIS — Z79899 Other long term (current) drug therapy: Secondary | ICD-10-CM | POA: Diagnosis not present

## 2022-04-14 DIAGNOSIS — D508 Other iron deficiency anemias: Secondary | ICD-10-CM | POA: Diagnosis not present

## 2022-04-14 DIAGNOSIS — G8929 Other chronic pain: Secondary | ICD-10-CM | POA: Diagnosis not present

## 2022-04-14 DIAGNOSIS — G893 Neoplasm related pain (acute) (chronic): Secondary | ICD-10-CM | POA: Diagnosis not present

## 2022-04-14 DIAGNOSIS — Z789 Other specified health status: Secondary | ICD-10-CM | POA: Diagnosis not present

## 2022-04-14 DIAGNOSIS — R7303 Prediabetes: Secondary | ICD-10-CM | POA: Diagnosis not present

## 2022-04-14 DIAGNOSIS — C760 Malignant neoplasm of head, face and neck: Secondary | ICD-10-CM | POA: Diagnosis not present

## 2022-05-14 DIAGNOSIS — Z79899 Other long term (current) drug therapy: Secondary | ICD-10-CM | POA: Diagnosis not present

## 2022-05-14 DIAGNOSIS — R7303 Prediabetes: Secondary | ICD-10-CM | POA: Diagnosis not present

## 2022-05-14 DIAGNOSIS — D508 Other iron deficiency anemias: Secondary | ICD-10-CM | POA: Diagnosis not present

## 2022-05-14 DIAGNOSIS — G8929 Other chronic pain: Secondary | ICD-10-CM | POA: Diagnosis not present

## 2022-05-14 DIAGNOSIS — Z681 Body mass index (BMI) 19 or less, adult: Secondary | ICD-10-CM | POA: Diagnosis not present

## 2022-05-14 DIAGNOSIS — E785 Hyperlipidemia, unspecified: Secondary | ICD-10-CM | POA: Diagnosis not present

## 2022-05-14 DIAGNOSIS — G893 Neoplasm related pain (acute) (chronic): Secondary | ICD-10-CM | POA: Diagnosis not present

## 2022-05-14 DIAGNOSIS — Z789 Other specified health status: Secondary | ICD-10-CM | POA: Diagnosis not present

## 2022-05-14 DIAGNOSIS — R6884 Jaw pain: Secondary | ICD-10-CM | POA: Diagnosis not present

## 2022-05-14 DIAGNOSIS — C760 Malignant neoplasm of head, face and neck: Secondary | ICD-10-CM | POA: Diagnosis not present

## 2022-05-16 DIAGNOSIS — Z79899 Other long term (current) drug therapy: Secondary | ICD-10-CM | POA: Diagnosis not present

## 2022-06-12 DIAGNOSIS — Z789 Other specified health status: Secondary | ICD-10-CM | POA: Diagnosis not present

## 2022-06-12 DIAGNOSIS — R6884 Jaw pain: Secondary | ICD-10-CM | POA: Diagnosis not present

## 2022-06-12 DIAGNOSIS — R7303 Prediabetes: Secondary | ICD-10-CM | POA: Diagnosis not present

## 2022-06-12 DIAGNOSIS — Z79899 Other long term (current) drug therapy: Secondary | ICD-10-CM | POA: Diagnosis not present

## 2022-06-12 DIAGNOSIS — C760 Malignant neoplasm of head, face and neck: Secondary | ICD-10-CM | POA: Diagnosis not present

## 2022-06-12 DIAGNOSIS — E785 Hyperlipidemia, unspecified: Secondary | ICD-10-CM | POA: Diagnosis not present

## 2022-06-12 DIAGNOSIS — G8929 Other chronic pain: Secondary | ICD-10-CM | POA: Diagnosis not present

## 2022-06-12 DIAGNOSIS — Z681 Body mass index (BMI) 19 or less, adult: Secondary | ICD-10-CM | POA: Diagnosis not present

## 2022-06-12 DIAGNOSIS — D508 Other iron deficiency anemias: Secondary | ICD-10-CM | POA: Diagnosis not present

## 2022-06-12 DIAGNOSIS — G893 Neoplasm related pain (acute) (chronic): Secondary | ICD-10-CM | POA: Diagnosis not present

## 2022-06-16 DIAGNOSIS — Z79899 Other long term (current) drug therapy: Secondary | ICD-10-CM | POA: Diagnosis not present

## 2022-07-14 DIAGNOSIS — D508 Other iron deficiency anemias: Secondary | ICD-10-CM | POA: Diagnosis not present

## 2022-07-14 DIAGNOSIS — R7303 Prediabetes: Secondary | ICD-10-CM | POA: Diagnosis not present

## 2022-07-14 DIAGNOSIS — Z79899 Other long term (current) drug therapy: Secondary | ICD-10-CM | POA: Diagnosis not present

## 2022-07-14 DIAGNOSIS — G893 Neoplasm related pain (acute) (chronic): Secondary | ICD-10-CM | POA: Diagnosis not present

## 2022-07-14 DIAGNOSIS — R6884 Jaw pain: Secondary | ICD-10-CM | POA: Diagnosis not present

## 2022-07-14 DIAGNOSIS — E785 Hyperlipidemia, unspecified: Secondary | ICD-10-CM | POA: Diagnosis not present

## 2022-07-14 DIAGNOSIS — Z789 Other specified health status: Secondary | ICD-10-CM | POA: Diagnosis not present

## 2022-07-14 DIAGNOSIS — C760 Malignant neoplasm of head, face and neck: Secondary | ICD-10-CM | POA: Diagnosis not present

## 2022-07-14 DIAGNOSIS — G8929 Other chronic pain: Secondary | ICD-10-CM | POA: Diagnosis not present

## 2022-08-14 DIAGNOSIS — C760 Malignant neoplasm of head, face and neck: Secondary | ICD-10-CM | POA: Diagnosis not present

## 2022-08-14 DIAGNOSIS — Z125 Encounter for screening for malignant neoplasm of prostate: Secondary | ICD-10-CM | POA: Diagnosis not present

## 2022-08-14 DIAGNOSIS — G893 Neoplasm related pain (acute) (chronic): Secondary | ICD-10-CM | POA: Diagnosis not present

## 2022-08-14 DIAGNOSIS — Z79899 Other long term (current) drug therapy: Secondary | ICD-10-CM | POA: Diagnosis not present

## 2022-08-14 DIAGNOSIS — R5383 Other fatigue: Secondary | ICD-10-CM | POA: Diagnosis not present

## 2022-08-14 DIAGNOSIS — D508 Other iron deficiency anemias: Secondary | ICD-10-CM | POA: Diagnosis not present

## 2022-08-14 DIAGNOSIS — Z0000 Encounter for general adult medical examination without abnormal findings: Secondary | ICD-10-CM | POA: Diagnosis not present

## 2022-08-14 DIAGNOSIS — R6884 Jaw pain: Secondary | ICD-10-CM | POA: Diagnosis not present

## 2022-08-14 DIAGNOSIS — E785 Hyperlipidemia, unspecified: Secondary | ICD-10-CM | POA: Diagnosis not present

## 2022-08-14 DIAGNOSIS — E559 Vitamin D deficiency, unspecified: Secondary | ICD-10-CM | POA: Diagnosis not present

## 2022-09-12 DIAGNOSIS — R5383 Other fatigue: Secondary | ICD-10-CM | POA: Diagnosis not present

## 2022-09-12 DIAGNOSIS — Z79899 Other long term (current) drug therapy: Secondary | ICD-10-CM | POA: Diagnosis not present

## 2022-09-12 DIAGNOSIS — R6884 Jaw pain: Secondary | ICD-10-CM | POA: Diagnosis not present

## 2022-09-12 DIAGNOSIS — G893 Neoplasm related pain (acute) (chronic): Secondary | ICD-10-CM | POA: Diagnosis not present

## 2022-09-12 DIAGNOSIS — E7800 Pure hypercholesterolemia, unspecified: Secondary | ICD-10-CM | POA: Diagnosis not present

## 2022-09-12 DIAGNOSIS — E559 Vitamin D deficiency, unspecified: Secondary | ICD-10-CM | POA: Diagnosis not present

## 2022-09-12 DIAGNOSIS — C760 Malignant neoplasm of head, face and neck: Secondary | ICD-10-CM | POA: Diagnosis not present

## 2022-09-12 DIAGNOSIS — Z125 Encounter for screening for malignant neoplasm of prostate: Secondary | ICD-10-CM | POA: Diagnosis not present

## 2022-09-12 DIAGNOSIS — R739 Hyperglycemia, unspecified: Secondary | ICD-10-CM | POA: Diagnosis not present

## 2022-09-16 DIAGNOSIS — Z79899 Other long term (current) drug therapy: Secondary | ICD-10-CM | POA: Diagnosis not present

## 2022-10-13 DIAGNOSIS — R6884 Jaw pain: Secondary | ICD-10-CM | POA: Diagnosis not present

## 2022-10-13 DIAGNOSIS — E785 Hyperlipidemia, unspecified: Secondary | ICD-10-CM | POA: Diagnosis not present

## 2022-10-13 DIAGNOSIS — C760 Malignant neoplasm of head, face and neck: Secondary | ICD-10-CM | POA: Diagnosis not present

## 2022-10-13 DIAGNOSIS — R7303 Prediabetes: Secondary | ICD-10-CM | POA: Diagnosis not present

## 2022-10-13 DIAGNOSIS — D508 Other iron deficiency anemias: Secondary | ICD-10-CM | POA: Diagnosis not present

## 2022-10-13 DIAGNOSIS — Z79899 Other long term (current) drug therapy: Secondary | ICD-10-CM | POA: Diagnosis not present

## 2022-10-13 DIAGNOSIS — E559 Vitamin D deficiency, unspecified: Secondary | ICD-10-CM | POA: Diagnosis not present

## 2022-10-13 DIAGNOSIS — G893 Neoplasm related pain (acute) (chronic): Secondary | ICD-10-CM | POA: Diagnosis not present

## 2022-11-12 DIAGNOSIS — E559 Vitamin D deficiency, unspecified: Secondary | ICD-10-CM | POA: Diagnosis not present

## 2022-11-12 DIAGNOSIS — R6884 Jaw pain: Secondary | ICD-10-CM | POA: Diagnosis not present

## 2022-11-12 DIAGNOSIS — C760 Malignant neoplasm of head, face and neck: Secondary | ICD-10-CM | POA: Diagnosis not present

## 2022-11-12 DIAGNOSIS — D508 Other iron deficiency anemias: Secondary | ICD-10-CM | POA: Diagnosis not present

## 2022-11-12 DIAGNOSIS — E785 Hyperlipidemia, unspecified: Secondary | ICD-10-CM | POA: Diagnosis not present

## 2022-11-12 DIAGNOSIS — G893 Neoplasm related pain (acute) (chronic): Secondary | ICD-10-CM | POA: Diagnosis not present

## 2022-11-12 DIAGNOSIS — Z79899 Other long term (current) drug therapy: Secondary | ICD-10-CM | POA: Diagnosis not present

## 2022-11-12 DIAGNOSIS — R7303 Prediabetes: Secondary | ICD-10-CM | POA: Diagnosis not present

## 2022-11-14 DIAGNOSIS — Z79899 Other long term (current) drug therapy: Secondary | ICD-10-CM | POA: Diagnosis not present

## 2022-12-12 DIAGNOSIS — E559 Vitamin D deficiency, unspecified: Secondary | ICD-10-CM | POA: Diagnosis not present

## 2022-12-12 DIAGNOSIS — C760 Malignant neoplasm of head, face and neck: Secondary | ICD-10-CM | POA: Diagnosis not present

## 2022-12-12 DIAGNOSIS — R6884 Jaw pain: Secondary | ICD-10-CM | POA: Diagnosis not present

## 2022-12-12 DIAGNOSIS — E785 Hyperlipidemia, unspecified: Secondary | ICD-10-CM | POA: Diagnosis not present

## 2022-12-12 DIAGNOSIS — G893 Neoplasm related pain (acute) (chronic): Secondary | ICD-10-CM | POA: Diagnosis not present

## 2022-12-12 DIAGNOSIS — Z79899 Other long term (current) drug therapy: Secondary | ICD-10-CM | POA: Diagnosis not present

## 2022-12-12 DIAGNOSIS — R7303 Prediabetes: Secondary | ICD-10-CM | POA: Diagnosis not present

## 2022-12-12 DIAGNOSIS — D508 Other iron deficiency anemias: Secondary | ICD-10-CM | POA: Diagnosis not present

## 2022-12-29 DIAGNOSIS — E559 Vitamin D deficiency, unspecified: Secondary | ICD-10-CM | POA: Diagnosis not present

## 2022-12-29 DIAGNOSIS — E785 Hyperlipidemia, unspecified: Secondary | ICD-10-CM | POA: Diagnosis not present

## 2022-12-29 DIAGNOSIS — R7303 Prediabetes: Secondary | ICD-10-CM | POA: Diagnosis not present

## 2022-12-29 DIAGNOSIS — C760 Malignant neoplasm of head, face and neck: Secondary | ICD-10-CM | POA: Diagnosis not present

## 2022-12-29 DIAGNOSIS — Z79899 Other long term (current) drug therapy: Secondary | ICD-10-CM | POA: Diagnosis not present

## 2022-12-29 DIAGNOSIS — G893 Neoplasm related pain (acute) (chronic): Secondary | ICD-10-CM | POA: Diagnosis not present

## 2022-12-29 DIAGNOSIS — D508 Other iron deficiency anemias: Secondary | ICD-10-CM | POA: Diagnosis not present

## 2022-12-29 DIAGNOSIS — R6884 Jaw pain: Secondary | ICD-10-CM | POA: Diagnosis not present

## 2023-01-27 DIAGNOSIS — G893 Neoplasm related pain (acute) (chronic): Secondary | ICD-10-CM | POA: Diagnosis not present

## 2023-01-27 DIAGNOSIS — E785 Hyperlipidemia, unspecified: Secondary | ICD-10-CM | POA: Diagnosis not present

## 2023-01-27 DIAGNOSIS — R6884 Jaw pain: Secondary | ICD-10-CM | POA: Diagnosis not present

## 2023-01-27 DIAGNOSIS — R7303 Prediabetes: Secondary | ICD-10-CM | POA: Diagnosis not present

## 2023-01-27 DIAGNOSIS — D508 Other iron deficiency anemias: Secondary | ICD-10-CM | POA: Diagnosis not present

## 2023-01-27 DIAGNOSIS — Z79899 Other long term (current) drug therapy: Secondary | ICD-10-CM | POA: Diagnosis not present

## 2023-01-27 DIAGNOSIS — C760 Malignant neoplasm of head, face and neck: Secondary | ICD-10-CM | POA: Diagnosis not present

## 2023-01-27 DIAGNOSIS — E559 Vitamin D deficiency, unspecified: Secondary | ICD-10-CM | POA: Diagnosis not present

## 2023-01-30 DIAGNOSIS — Z79899 Other long term (current) drug therapy: Secondary | ICD-10-CM | POA: Diagnosis not present

## 2023-03-10 DIAGNOSIS — E785 Hyperlipidemia, unspecified: Secondary | ICD-10-CM | POA: Diagnosis not present

## 2023-03-10 DIAGNOSIS — G893 Neoplasm related pain (acute) (chronic): Secondary | ICD-10-CM | POA: Diagnosis not present

## 2023-03-10 DIAGNOSIS — C760 Malignant neoplasm of head, face and neck: Secondary | ICD-10-CM | POA: Diagnosis not present

## 2023-03-10 DIAGNOSIS — Z79899 Other long term (current) drug therapy: Secondary | ICD-10-CM | POA: Diagnosis not present

## 2023-03-10 DIAGNOSIS — E559 Vitamin D deficiency, unspecified: Secondary | ICD-10-CM | POA: Diagnosis not present

## 2023-03-10 DIAGNOSIS — D508 Other iron deficiency anemias: Secondary | ICD-10-CM | POA: Diagnosis not present

## 2023-03-10 DIAGNOSIS — R7303 Prediabetes: Secondary | ICD-10-CM | POA: Diagnosis not present

## 2023-04-07 DIAGNOSIS — E785 Hyperlipidemia, unspecified: Secondary | ICD-10-CM | POA: Diagnosis not present

## 2023-04-07 DIAGNOSIS — C760 Malignant neoplasm of head, face and neck: Secondary | ICD-10-CM | POA: Diagnosis not present

## 2023-04-07 DIAGNOSIS — R7303 Prediabetes: Secondary | ICD-10-CM | POA: Diagnosis not present

## 2023-04-07 DIAGNOSIS — E559 Vitamin D deficiency, unspecified: Secondary | ICD-10-CM | POA: Diagnosis not present

## 2023-04-07 DIAGNOSIS — D508 Other iron deficiency anemias: Secondary | ICD-10-CM | POA: Diagnosis not present

## 2023-04-07 DIAGNOSIS — G893 Neoplasm related pain (acute) (chronic): Secondary | ICD-10-CM | POA: Diagnosis not present

## 2023-04-07 DIAGNOSIS — Z79899 Other long term (current) drug therapy: Secondary | ICD-10-CM | POA: Diagnosis not present

## 2023-04-10 DIAGNOSIS — Z79899 Other long term (current) drug therapy: Secondary | ICD-10-CM | POA: Diagnosis not present

## 2023-05-06 DIAGNOSIS — E785 Hyperlipidemia, unspecified: Secondary | ICD-10-CM | POA: Diagnosis not present

## 2023-05-06 DIAGNOSIS — D508 Other iron deficiency anemias: Secondary | ICD-10-CM | POA: Diagnosis not present

## 2023-05-06 DIAGNOSIS — Z79899 Other long term (current) drug therapy: Secondary | ICD-10-CM | POA: Diagnosis not present

## 2023-05-06 DIAGNOSIS — C760 Malignant neoplasm of head, face and neck: Secondary | ICD-10-CM | POA: Diagnosis not present

## 2023-05-06 DIAGNOSIS — R7303 Prediabetes: Secondary | ICD-10-CM | POA: Diagnosis not present

## 2023-05-06 DIAGNOSIS — E559 Vitamin D deficiency, unspecified: Secondary | ICD-10-CM | POA: Diagnosis not present

## 2023-05-06 DIAGNOSIS — G893 Neoplasm related pain (acute) (chronic): Secondary | ICD-10-CM | POA: Diagnosis not present

## 2023-05-12 DIAGNOSIS — Z79899 Other long term (current) drug therapy: Secondary | ICD-10-CM | POA: Diagnosis not present

## 2023-06-02 DIAGNOSIS — Z681 Body mass index (BMI) 19 or less, adult: Secondary | ICD-10-CM | POA: Diagnosis not present

## 2023-06-02 DIAGNOSIS — R7303 Prediabetes: Secondary | ICD-10-CM | POA: Diagnosis not present

## 2023-06-02 DIAGNOSIS — Z79899 Other long term (current) drug therapy: Secondary | ICD-10-CM | POA: Diagnosis not present

## 2023-06-02 DIAGNOSIS — C760 Malignant neoplasm of head, face and neck: Secondary | ICD-10-CM | POA: Diagnosis not present

## 2023-06-02 DIAGNOSIS — G893 Neoplasm related pain (acute) (chronic): Secondary | ICD-10-CM | POA: Diagnosis not present

## 2023-06-02 DIAGNOSIS — E785 Hyperlipidemia, unspecified: Secondary | ICD-10-CM | POA: Diagnosis not present

## 2023-06-02 DIAGNOSIS — D508 Other iron deficiency anemias: Secondary | ICD-10-CM | POA: Diagnosis not present

## 2023-06-02 DIAGNOSIS — E559 Vitamin D deficiency, unspecified: Secondary | ICD-10-CM | POA: Diagnosis not present

## 2023-06-05 DIAGNOSIS — Z79899 Other long term (current) drug therapy: Secondary | ICD-10-CM | POA: Diagnosis not present

## 2023-06-30 DIAGNOSIS — R7303 Prediabetes: Secondary | ICD-10-CM | POA: Diagnosis not present

## 2023-06-30 DIAGNOSIS — Z79899 Other long term (current) drug therapy: Secondary | ICD-10-CM | POA: Diagnosis not present

## 2023-06-30 DIAGNOSIS — C760 Malignant neoplasm of head, face and neck: Secondary | ICD-10-CM | POA: Diagnosis not present

## 2023-06-30 DIAGNOSIS — E785 Hyperlipidemia, unspecified: Secondary | ICD-10-CM | POA: Diagnosis not present

## 2023-06-30 DIAGNOSIS — G893 Neoplasm related pain (acute) (chronic): Secondary | ICD-10-CM | POA: Diagnosis not present

## 2023-06-30 DIAGNOSIS — D508 Other iron deficiency anemias: Secondary | ICD-10-CM | POA: Diagnosis not present

## 2023-06-30 DIAGNOSIS — E559 Vitamin D deficiency, unspecified: Secondary | ICD-10-CM | POA: Diagnosis not present

## 2023-07-31 DIAGNOSIS — C760 Malignant neoplasm of head, face and neck: Secondary | ICD-10-CM | POA: Diagnosis not present

## 2023-07-31 DIAGNOSIS — D508 Other iron deficiency anemias: Secondary | ICD-10-CM | POA: Diagnosis not present

## 2023-07-31 DIAGNOSIS — E559 Vitamin D deficiency, unspecified: Secondary | ICD-10-CM | POA: Diagnosis not present

## 2023-07-31 DIAGNOSIS — E785 Hyperlipidemia, unspecified: Secondary | ICD-10-CM | POA: Diagnosis not present

## 2023-07-31 DIAGNOSIS — Z79899 Other long term (current) drug therapy: Secondary | ICD-10-CM | POA: Diagnosis not present

## 2023-07-31 DIAGNOSIS — G893 Neoplasm related pain (acute) (chronic): Secondary | ICD-10-CM | POA: Diagnosis not present

## 2023-07-31 DIAGNOSIS — R7303 Prediabetes: Secondary | ICD-10-CM | POA: Diagnosis not present

## 2023-08-28 DIAGNOSIS — R6884 Jaw pain: Secondary | ICD-10-CM | POA: Diagnosis not present

## 2023-08-28 DIAGNOSIS — G893 Neoplasm related pain (acute) (chronic): Secondary | ICD-10-CM | POA: Diagnosis not present

## 2023-08-28 DIAGNOSIS — R5383 Other fatigue: Secondary | ICD-10-CM | POA: Diagnosis not present

## 2023-08-28 DIAGNOSIS — R7303 Prediabetes: Secondary | ICD-10-CM | POA: Diagnosis not present

## 2023-08-28 DIAGNOSIS — E559 Vitamin D deficiency, unspecified: Secondary | ICD-10-CM | POA: Diagnosis not present

## 2023-08-28 DIAGNOSIS — Z79899 Other long term (current) drug therapy: Secondary | ICD-10-CM | POA: Diagnosis not present

## 2023-08-28 DIAGNOSIS — C760 Malignant neoplasm of head, face and neck: Secondary | ICD-10-CM | POA: Diagnosis not present

## 2023-08-28 DIAGNOSIS — D508 Other iron deficiency anemias: Secondary | ICD-10-CM | POA: Diagnosis not present

## 2023-08-28 DIAGNOSIS — Z125 Encounter for screening for malignant neoplasm of prostate: Secondary | ICD-10-CM | POA: Diagnosis not present

## 2023-08-28 DIAGNOSIS — Z0000 Encounter for general adult medical examination without abnormal findings: Secondary | ICD-10-CM | POA: Diagnosis not present

## 2023-09-24 DIAGNOSIS — R6884 Jaw pain: Secondary | ICD-10-CM | POA: Diagnosis not present

## 2023-09-24 DIAGNOSIS — E559 Vitamin D deficiency, unspecified: Secondary | ICD-10-CM | POA: Diagnosis not present

## 2023-09-24 DIAGNOSIS — E785 Hyperlipidemia, unspecified: Secondary | ICD-10-CM | POA: Diagnosis not present

## 2023-09-24 DIAGNOSIS — Z79899 Other long term (current) drug therapy: Secondary | ICD-10-CM | POA: Diagnosis not present

## 2023-09-24 DIAGNOSIS — G893 Neoplasm related pain (acute) (chronic): Secondary | ICD-10-CM | POA: Diagnosis not present

## 2023-09-24 DIAGNOSIS — R7303 Prediabetes: Secondary | ICD-10-CM | POA: Diagnosis not present

## 2023-09-24 DIAGNOSIS — C760 Malignant neoplasm of head, face and neck: Secondary | ICD-10-CM | POA: Diagnosis not present
